# Patient Record
Sex: Female | Born: 1955 | Race: White | Hispanic: No | Smoking: Former smoker
Health system: Southern US, Community
[De-identification: ages and names within clinical notes are randomized; demographics above are authoritative.]

## PROBLEM LIST (undated history)

## (undated) DIAGNOSIS — M8580 Other specified disorders of bone density and structure, unspecified site: Secondary | ICD-10-CM

## (undated) DIAGNOSIS — I4892 Unspecified atrial flutter: Secondary | ICD-10-CM

## (undated) DIAGNOSIS — I340 Nonrheumatic mitral (valve) insufficiency: Secondary | ICD-10-CM

## (undated) DIAGNOSIS — M1990 Unspecified osteoarthritis, unspecified site: Secondary | ICD-10-CM

## (undated) DIAGNOSIS — C801 Malignant (primary) neoplasm, unspecified: Secondary | ICD-10-CM

## (undated) DIAGNOSIS — I350 Nonrheumatic aortic (valve) stenosis: Secondary | ICD-10-CM

## (undated) DIAGNOSIS — E039 Hypothyroidism, unspecified: Secondary | ICD-10-CM

## (undated) DIAGNOSIS — I480 Paroxysmal atrial fibrillation: Secondary | ICD-10-CM

## (undated) DIAGNOSIS — M539 Dorsopathy, unspecified: Secondary | ICD-10-CM

## (undated) DIAGNOSIS — I517 Cardiomegaly: Secondary | ICD-10-CM

## (undated) DIAGNOSIS — I499 Cardiac arrhythmia, unspecified: Secondary | ICD-10-CM

## (undated) DIAGNOSIS — I2510 Atherosclerotic heart disease of native coronary artery without angina pectoris: Secondary | ICD-10-CM

## (undated) DIAGNOSIS — I351 Nonrheumatic aortic (valve) insufficiency: Secondary | ICD-10-CM

## (undated) DIAGNOSIS — Z9889 Other specified postprocedural states: Secondary | ICD-10-CM

## (undated) DIAGNOSIS — E079 Disorder of thyroid, unspecified: Secondary | ICD-10-CM

## (undated) DIAGNOSIS — M810 Age-related osteoporosis without current pathological fracture: Secondary | ICD-10-CM

## (undated) DIAGNOSIS — I509 Heart failure, unspecified: Secondary | ICD-10-CM

## (undated) DIAGNOSIS — I10 Essential (primary) hypertension: Secondary | ICD-10-CM

## (undated) DIAGNOSIS — N289 Disorder of kidney and ureter, unspecified: Secondary | ICD-10-CM

## (undated) DIAGNOSIS — I4891 Unspecified atrial fibrillation: Secondary | ICD-10-CM

## (undated) HISTORY — PX: CARPAL TUNNEL RELEASE: SHX101

## (undated) HISTORY — PX: CARDIAC CATHETERIZATION: SHX172

## (undated) HISTORY — DX: Hypothyroidism, unspecified: E03.9

## (undated) HISTORY — PX: OTHER SURGICAL HISTORY: SHX169

## (undated) HISTORY — DX: Unspecified atrial flutter: I48.92

## (undated) HISTORY — DX: Paroxysmal atrial fibrillation: I48.0

## (undated) HISTORY — PX: EYE SURGERY: SHX253

---
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
## 2019-12-24 ENCOUNTER — Other Ambulatory Visit: Payer: Self-pay | Admitting: Internal Medicine

## 2019-12-24 DIAGNOSIS — Z1231 Encounter for screening mammogram for malignant neoplasm of breast: Secondary | ICD-10-CM

## 2020-02-05 ENCOUNTER — Encounter: Payer: Self-pay | Admitting: *Deleted

## 2020-02-05 ENCOUNTER — Other Ambulatory Visit: Payer: Self-pay

## 2020-02-05 ENCOUNTER — Emergency Department: Payer: BC Managed Care – PPO

## 2020-02-05 ENCOUNTER — Emergency Department
Admission: EM | Admit: 2020-02-05 | Discharge: 2020-02-05 | Disposition: A | Discharge status: Home / Self Care | Payer: BC Managed Care – PPO | Attending: Student in an Organized Health Care Education/Training Program | Admitting: Student in an Organized Health Care Education/Training Program

## 2020-02-05 DIAGNOSIS — M545 Low back pain, unspecified: Secondary | ICD-10-CM

## 2020-02-05 DIAGNOSIS — Z79890 Hormone replacement therapy (postmenopausal): Secondary | ICD-10-CM | POA: Insufficient documentation

## 2020-02-05 DIAGNOSIS — F17200 Nicotine dependence, unspecified, uncomplicated: Secondary | ICD-10-CM | POA: Diagnosis not present

## 2020-02-05 DIAGNOSIS — E039 Hypothyroidism, unspecified: Secondary | ICD-10-CM | POA: Insufficient documentation

## 2020-02-05 DIAGNOSIS — Z85828 Personal history of other malignant neoplasm of skin: Secondary | ICD-10-CM | POA: Diagnosis not present

## 2020-02-05 DIAGNOSIS — Z7982 Long term (current) use of aspirin: Secondary | ICD-10-CM | POA: Insufficient documentation

## 2020-02-05 DIAGNOSIS — I4891 Unspecified atrial fibrillation: Secondary | ICD-10-CM | POA: Insufficient documentation

## 2020-02-05 HISTORY — DX: Disorder of kidney and ureter, unspecified: N28.9

## 2020-02-05 HISTORY — DX: Other specified disorders of bone density and structure, unspecified site: M85.80

## 2020-02-05 HISTORY — DX: Unspecified atrial fibrillation: I48.91

## 2020-02-05 HISTORY — DX: Malignant (primary) neoplasm, unspecified: C80.1

## 2020-02-05 HISTORY — DX: Other specified postprocedural states: Z98.890

## 2020-02-05 HISTORY — DX: Unspecified osteoarthritis, unspecified site: M19.90

## 2020-02-05 HISTORY — DX: Disorder of thyroid, unspecified: E07.9

## 2020-02-05 LAB — URINALYSIS, COMPLETE (UACMP) WITH MICROSCOPIC
Bilirubin Urine: NEGATIVE
Glucose, UA: NEGATIVE mg/dL
Hgb urine dipstick: NEGATIVE
Ketones, ur: NEGATIVE mg/dL
Leukocytes,Ua: NEGATIVE
Nitrite: NEGATIVE
Protein, ur: NEGATIVE mg/dL
Specific Gravity, Urine: 1.01 (ref 1.005–1.030)
pH: 7 (ref 5.0–8.0)

## 2020-02-05 MED ORDER — LIDOCAINE 5 % EX PTCH
1.0000 | MEDICATED_PATCH | CUTANEOUS | 0 refills | Status: DC
Start: 2020-02-05 — End: 2020-02-05

## 2020-02-05 MED ORDER — LIDOCAINE 5 % EX PTCH
1.0000 | MEDICATED_PATCH | CUTANEOUS | 0 refills | Status: DC
Start: 2020-02-05 — End: 2020-07-15

## 2020-02-05 MED ORDER — OXYCODONE-ACETAMINOPHEN 5-325 MG PO TABS
1.0000 | ORAL_TABLET | Freq: Once | ORAL | Status: AC
  Administered 2020-02-05: 1 via ORAL
  Filled 2020-02-05: qty 1

## 2020-02-05 MED ORDER — OXYCODONE-ACETAMINOPHEN 5-325 MG PO TABS: 1.0000 | ORAL_TABLET | Freq: Three times a day (TID) | ORAL | 0 refills | Status: DC | PRN

## 2020-02-05 MED ORDER — METHOCARBAMOL 500 MG PO TABS: 500.0000 mg | ORAL_TABLET | Freq: Four times a day (QID) | ORAL | 0 refills | Status: DC

## 2020-02-05 MED ORDER — ORPHENADRINE CITRATE 30 MG/ML IJ SOLN
60.0000 mg | Freq: Two times a day (BID) | INTRAMUSCULAR | Status: DC
  Administered 2020-02-05: 60 mg via INTRAMUSCULAR
  Filled 2020-02-05: qty 2

## 2020-02-05 MED ORDER — LIDOCAINE 5 % EX PTCH
1.0000 | MEDICATED_PATCH | CUTANEOUS | Status: DC
  Administered 2020-02-05: 1 via TRANSDERMAL
  Filled 2020-02-05: qty 1

## 2020-02-05 NOTE — ED Provider Notes (Signed)
[facility] Emergency Department Provider Note  ____________________________________________  Time seen: Approximately 1:22 PM  I have reviewed the triage vital signs and the nursing notes.   HISTORY  Chief Complaint Back Pain    HPI Tricia Alvarez is a 64 y.o. female that presents to the emergency department for evaluation of right low back pain that radiates to the side of right hip and into the front of right leg for 11 days.  She had increased spasms to her back today.  Patient was evaluated at [facility] clinic 1 week ago.  She had x-rays completed but did not receive any official results.  She was started on steroids, Flexeril, Vicodin without improvement of symptoms.  They have given her a referral to physiatry and she has an appointment on Monday.  No trauma.  No bowel or bladder dysfunction or saddle anesthesias.  No fever, nausea, vomiting, abdominal pain, urinary symptoms.   Past Medical History:  Diagnosis Date   Arthritis    Atrial fibrillation ([place])    Cancer ([place])    skin and vulvular   History of cardiac radiofrequency ablation    Osteopenia    Renal disorder    right nephrectomy   Thyroid disease    hypothyroid    There are no problems to display for this patient.   Past Surgical History:  Procedure Laterality Date   CARPAL TUNNEL RELEASE Bilateral    CESAREAN SECTION      Prior to Admission medications   Medication Sig Start Date End Date Taking? Authorizing Provider  Ascorbic Acid (VITAMIN C) 1000 MG tablet Take 1,000 mg by mouth daily.   Yes [provider]  aspirin 325 MG tablet Take 325 mg by mouth daily.   Yes [provider]  cholecalciferol (VITAMIN D3) 25 MCG (1000 UNIT) tablet Take 2,000 Units by mouth daily.   Yes [provider]  cyclobenzaprine (FLEXERIL) 5 MG tablet Take 5 mg by mouth 3 (three) times daily as needed for muscle spasms.   Yes [provider]  diltiazem  (DILACOR XR) 180 MG 24 hr capsule Take 180 mg by mouth daily.   Yes [provider]  dronedarone (MULTAQ) 400 MG tablet Take 400 mg by mouth 2 (two) times daily with a meal.   Yes [provider]  fenoprofen (NALFON) 600 MG TABS tablet Take 600 mg by mouth.   Yes [provider]  HYDROcodone-acetaminophen (NORCO/VICODIN) 5-325 MG tablet Take 1 tablet by mouth 3 (three) times daily as needed for moderate pain.   Yes [provider]  levothyroxine (SYNTHROID) 75 MCG tablet Take 75 mcg by mouth daily before breakfast.   Yes [provider]  magnesium gluconate (MAGONATE) 500 MG tablet Take 500 mg by mouth daily.   Yes [provider]  Multiple Vitamin (MULTIVITAMIN) tablet Take 1 tablet by mouth daily.   Yes [provider]  lidocaine (LIDODERM) 5 % Place 1 patch onto the skin daily. Remove & Discard patch within 12 hours or as directed by MD 02/05/20   Laban Emperor, PA-C  methocarbamol (ROBAXIN) 500 MG tablet Take 1 tablet (500 mg total) by mouth 4 (four) times daily. 02/05/20   Laban Emperor, PA-C  oxyCODONE-acetaminophen (PERCOCET) 5-325 MG tablet Take 1 tablet by mouth every 8 (eight) hours as needed for severe pain. 02/05/20 02/04/21  Laban Emperor, PA-C    Allergies Morphine and related  No family history on file.  Social History Social History   Tobacco Use  Smoking status: Current Every Day Smoker   Smokeless tobacco: Never Used  Substance Use Topics   Alcohol use: Yes    Comment: socially   Drug use: Not on file     Review of Systems  Constitutional: No fever/chills Cardiovascular: No chest pain. Respiratory: No SOB. Gastrointestinal: No abdominal pain.  No nausea, no vomiting.  Musculoskeletal: Positive for back pain. Skin: Negative for rash, abrasions, lacerations, ecchymosis. Neurological: Negative for numbness or tingling   ____________________________________________   PHYSICAL EXAM:  VITAL  SIGNS: ED Triage Vitals  Enc Vitals Group     BP 02/05/20 1157 (!) 146/91     Pulse Rate 02/05/20 1157 84     Resp 02/05/20 1157 18     Temp 02/05/20 1157 97.9 F (36.6 C)     Temp Source 02/05/20 1157 Oral     SpO2 02/05/20 1157 98 %     Weight 02/05/20 1200 150 lb (68 kg)     Height 02/05/20 1200 5\' 7"  (1.702 m)     Head Circumference --      Peak Flow --      Pain Score 02/05/20 1159 10     Pain Loc --      Pain Edu? --      Excl. in [place]? --      Constitutional: Alert and oriented. Well appearing and in no acute distress. Eyes: Conjunctivae are normal. PERRL. EOMI. Head: Atraumatic. ENT:      Ears:      Nose: No congestion/rhinnorhea.      Mouth/Throat: Mucous membranes are moist.  Neck: No stridor.  Cardiovascular: Normal rate, regular rhythm.  Good peripheral circulation. Respiratory: Normal respiratory effort without tachypnea or retractions. Lungs CTAB. Good air entry to the bases with no decreased or absent breath sounds. Musculoskeletal: Full range of motion to all extremities. No gross deformities appreciated. Tenderness to palpation to right SI joint. Strength equal in lower extremities bilaterally. Full ROM of toes. Normal pain. Neurologic:  Normal speech and language. No gross focal neurologic deficits are appreciated.  Skin:  Skin is warm, dry and intact. No rash noted. Psychiatric: Mood and affect are normal. Speech and behavior are normal. Patient exhibits appropriate insight and judgement.   ____________________________________________   LABS (all labs ordered are listed, but only abnormal results are displayed)  Labs Reviewed  URINALYSIS, COMPLETE (UACMP) WITH MICROSCOPIC - Abnormal; Notable for the following components:      Result Value   Color, Urine YELLOW (*)    APPearance HAZY (*)    Bacteria, UA FEW (*)    All other components within normal limits    ____________________________________________  EKG   ____________________________________________  RADIOLOGY   No results found.  ____________________________________________    PROCEDURES  Procedure(s) performed:    Procedures    Medications  orphenadrine (NORFLEX) injection 60 mg (60 mg Intramuscular Given 02/05/20 1414)  lidocaine (LIDODERM) 5 % 1 patch (1 patch Transdermal Patch Applied 02/05/20 1415)  oxyCODONE-acetaminophen (PERCOCET/ROXICET) 5-325 MG per tablet 1 tablet (1 tablet Oral Given 02/05/20 1413)     ____________________________________________   INITIAL IMPRESSION / ASSESSMENT AND PLAN / ED COURSE  Pertinent labs & imaging results that were available during my care of the patient were reviewed by me and considered in my medical decision making (see chart for details).  Review of the [place] CSRS was performed in accordance of the [place] prior to dispensing any controlled drugs.   Patient's diagnosis is consistent with lumbar radiculopathy.  Patient  had x-rays completed 1 week ago at [place] [facility].  I am unable to see the results and patient received preliminary results from her PA but did not receive any official results.  Patient prefers not to repeat x-rays today.  She has an appointment with physiatry on Monday.  Patient was given IM Norflex, oral Percocet and Lidoderm patch for pain.  Patient will be discharged home with prescriptions for robaxin, percocet, lidoderm. Patient is to follow up with physiatry as directed. Patient is given ED precautions to return to the ED for any worsening or new symptoms.   Tricia Alvarez was evaluated in Emergency Department on 02/05/2020 for the symptoms described in the history of present illness. She was evaluated in the context of the global COVID-19 pandemic, which necessitated consideration that the patient might be at risk for infection with the SARS-CoV-2 virus that causes COVID-19. Institutional protocols and  algorithms that pertain to the evaluation of patients at risk for COVID-19 are in a state of rapid change based on information released by regulatory bodies including the CDC and federal and state organizations. These policies and algorithms were followed during the patient's care in the ED.  ____________________________________________  FINAL CLINICAL IMPRESSION(S) / ED DIAGNOSES  Final diagnoses:  Acute right-sided low back pain, unspecified whether sciatica present      NEW MEDICATIONS STARTED DURING THIS VISIT:  ED Discharge Orders         Ordered    methocarbamol (ROBAXIN) 500 MG tablet  4 times daily,   Status:  Discontinued     Reprint     02/05/20 1433    lidocaine (LIDODERM) 5 %  Every 24 hours,   Status:  Discontinued     Reprint     02/05/20 1433    oxyCODONE-acetaminophen (PERCOCET) 5-325 MG tablet  Every 8 hours PRN,   Status:  Discontinued     Reprint     02/05/20 1433    lidocaine (LIDODERM) 5 %  Every 24 hours     Discontinue  Reprint     02/05/20 1452    methocarbamol (ROBAXIN) 500 MG tablet  4 times daily     Discontinue  Reprint     02/05/20 1452    oxyCODONE-acetaminophen (PERCOCET) 5-325 MG tablet  Every 8 hours PRN     Discontinue  Reprint     02/05/20 1452              This chart was dictated using voice recognition software/Dragon. Despite best efforts to proofread, errors can occur which can change the meaning. Any change was purely unintentional.    Laban Emperor, PA-C 02/05/20 1518    Merlyn Lot, MD 02/05/20 [phone]

## 2020-02-05 NOTE — ED Triage Notes (Signed)
Patient c/o right lumbar pain that radiates down right leg for 11 days. Patient states she was seen at a walk-in clinic a week ago and had imaging and was discharged with prescriptions. Patient has a follow-up schedule for Monday, but spasms increased today.

## 2020-02-09 ENCOUNTER — Other Ambulatory Visit: Payer: Self-pay | Admitting: Physical Medicine & Rehabilitation

## 2020-02-09 DIAGNOSIS — M5441 Lumbago with sciatica, right side: Secondary | ICD-10-CM

## 2020-02-10 ENCOUNTER — Other Ambulatory Visit: Payer: Self-pay

## 2020-02-10 ENCOUNTER — Ambulatory Visit
Admission: RE | Admit: 2020-02-10 | Discharge: 2020-02-10 | Disposition: A | Discharge status: Home / Self Care | Payer: BC Managed Care – PPO | Source: Ambulatory Visit | Attending: Physical Medicine & Rehabilitation | Admitting: Physical Medicine & Rehabilitation

## 2020-02-10 DIAGNOSIS — M5441 Lumbago with sciatica, right side: Secondary | ICD-10-CM

## 2020-03-24 ENCOUNTER — Ambulatory Visit
Admission: RE | Admit: 2020-03-24 | Discharge: 2020-03-24 | Disposition: A | Discharge status: Home / Self Care | Payer: BC Managed Care – PPO | Source: Ambulatory Visit | Attending: Internal Medicine | Admitting: Internal Medicine

## 2020-03-24 ENCOUNTER — Other Ambulatory Visit: Payer: Self-pay

## 2020-03-24 DIAGNOSIS — Z1231 Encounter for screening mammogram for malignant neoplasm of breast: Secondary | ICD-10-CM | POA: Insufficient documentation

## 2020-07-15 ENCOUNTER — Ambulatory Visit: Payer: BC Managed Care – PPO | Admitting: Internal Medicine

## 2020-07-15 ENCOUNTER — Other Ambulatory Visit: Payer: Self-pay

## 2020-07-15 ENCOUNTER — Encounter: Payer: Self-pay | Admitting: Internal Medicine

## 2020-07-15 VITALS — BP 148/84 | HR 69 | Ht 67.0 in | Wt 152.5 lb

## 2020-07-15 DIAGNOSIS — I483 Typical atrial flutter: Secondary | ICD-10-CM | POA: Diagnosis not present

## 2020-07-15 DIAGNOSIS — I480 Paroxysmal atrial fibrillation: Secondary | ICD-10-CM | POA: Diagnosis not present

## 2020-07-15 NOTE — Progress Notes (Signed)
ELECTROPHYSIOLOGY CONSULT NOTE  Patient ID: Tricia Alvarez, MRN: [id number], DOB/AGE: 64-Jun-1957 64 y.o. Admit date: (Not on file) Date of Consult: 07/15/2020  Primary Physician: Kirk Ruths, MD Primary Cardiologist: previously in [place] is a 65 y.o. female who is being seen today for the evaluation of afib at the request of MA.    HPI Tricia Alvarez is a 65 y.o. female who is referred for management issues related to atrial fibrillation.  A few years ago, prior to COVID, she was on a cruise and had syncope.  She was found to be in atrial fibrillation-rapid.  Apparently she underwent cardioversion either unsure offshore.  By the time she had returned home to [facility] she was back in atrial fibrillation.  She was treated with diltiazem, dronaderone and then underwent catheter ablation.  Since that time she has had no clinical atrial fibrillation.  Eliquis was started.  A Holter monitor dated 3/21 reported no interval atrial fibrillation and it was recommended that her Eliquis be discontinued and aspirin 325 be initiated.  She has been taking that here to date.  She has also been taking her dronaderone.  Moreover, she is taking diltiazem which was started also at the time of her atrial fibrillation episode.  She denies any antecedent history of atrial fibrillation.  No sleep disordered breathing of which she is aware, no daytime somnolence.  She reports her echocardiogram was normal.   4/20 echocardiogram demonstrated normal LV function Date Cr K Hgb  1/21 1.0 3.7        Past Medical History:  Diagnosis Date  . Arthritis   . Atrial fibrillation ([place])   . 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 History of cardiac radiofrequency ablation   . Osteopenia   . Renal disorder    right nephrectomy  . Thyroid disease    hypothyroid      Surgical History:  Past Surgical History:  Procedure Laterality Date  . CARPAL TUNNEL RELEASE Bilateral   .  CESAREAN SECTION       Home Meds: Current Meds  Medication Sig  . Ascorbic Acid (VITAMIN C) 1000 MG tablet Take 1,000 mg by mouth daily.  Marland Kitchen aspirin 325 MG tablet Take 325 mg by mouth daily.  . cholecalciferol (VITAMIN D3) 25 MCG (1000 UNIT) tablet Take 2,000 Units by mouth daily.  Marland Kitchen diltiazem (DILACOR XR) 180 MG 24 hr capsule Take 180 mg by mouth daily.  Marland Kitchen dronedarone (MULTAQ) 400 MG tablet Take 400 mg by mouth 2 (two) times daily with a meal.  . fenoprofen (NALFON) 600 MG TABS tablet Take 600 mg by mouth.  . levothyroxine (SYNTHROID) 75 MCG tablet Take 75 mcg by mouth daily before breakfast.  . magnesium gluconate (MAGONATE) 500 MG tablet Take 500 mg by mouth daily.  . Multiple Vitamin (MULTIVITAMIN) tablet Take 1 tablet by mouth daily.    Allergies:  Allergies  Allergen Reactions  . Morphine And Related Nausea And Vomiting    Social History   Socioeconomic History  . Marital status: Divorced    Spouse name: Not on file  . Number of children: Not on file  . Years of education: Not on file  . Highest education level: Not on file  Occupational History  . Not on file  Tobacco Use  . Smoking status: Current Every Day Smoker  . Smokeless tobacco: Never Used  Substance and Sexual Activity  . Alcohol  use: Yes    Comment: socially  . Drug use: Not on file  . Sexual activity: Not on file  Other Topics Concern  . Not on file  Social History Narrative  . Not on file   Social Determinants of Health   Financial Resource Strain: Not on file  Food Insecurity: Not on file  Transportation Needs: Not on file  Physical Activity: Not on file  Stress: Not on file  Social Connections: Not on file  Intimate Partner Violence: Not on file     Family History  Problem Relation Age of Onset  . Breast cancer Cousin      ROS:  Please see the history of present illness.     All other systems reviewed and negative.    Physical Exam: Blood pressure (!) 148/84, pulse 69, height 5\' 7"   (1.702 m), weight 152 lb 8 oz (69.2 kg), SpO2 96 %. General: Well developed, well nourished female in no acute distress. Head: Normocephalic, atraumatic, sclera non-icteric, no xanthomas, nares are without discharge. EENT: normal  Lymph Nodes:  none Neck: Negative for carotid bruits. JVD not elevated. Back:without scoliosis kyphosis Lungs: Clear bilaterally to auscultation without wheezes, rales, or rhonchi. Breathing is unlabored. Heart: RRR with S1 S2. No  murmur . No rubs, or gallops appreciated. Abdomen: Soft, non-tender, non-distended with normoactive bowel sounds. No hepatomegaly. No rebound/guarding. No obvious abdominal masses. Msk:  Strength and tone appear normal for age. Extremities: No clubbing or cyanosis. No edema.  Distal pedal pulses are 2+ and equal bilaterally. Skin: Warm and Dry Neuro: Alert and oriented X 3. CN III-XII intact Grossly normal sensory and motor function . Psych:  Responds to questions appropriately with a normal affect.      Labs: Cardiac Enzymes No results for input(s): CKTOTAL, CKMB, TROPONINI in the last 72 hours. CBC No results found for: WBC, HGB, HCT, MCV, PLT PROTIME: No results for input(s): LABPROT, INR in the last 72 hours. Chemistry No results for input(s): NA, K, CL, CO2, BUN, CREATININE, CALCIUM, PROT, BILITOT, ALKPHOS, ALT, AST, GLUCOSE in the last 168 hours.  Invalid input(s): LABALBU Lipids No results found for: CHOL, HDL, LDLCALC, TRIG BNP No results found for: PROBNP Thyroid Function Tests: No results for input(s): TSH, T4TOTAL, T3FREE, THYROIDAB in the last 72 hours.  Invalid input(s): FREET3 Miscellaneous No results found for: DDIMER  Radiology/Studies:  No results found.  EKG: Sinus at 69 Intervals 13/11/41   Assessment and Plan:  Atrial fibrillation status post ablation  Elevated blood pressure  Cigarette use   The patient has atrial fibrillation with a prior history of ablation.  Currently managed with  dronaderone.  She asks that I agree that it is reasonable to stop the dronaderone and see whether atrial fibrillation recurs.  As we have no idea what the symptoms would be, I have recommended that she get a Fitbit to look for changes in heart rate as a manifestation.  I have also recommended that she stop her aspirin.  There are not data which I am aware that would justify ASA 706 for thromboembolic risk reduction in patients with atrial fibrillation.  Moreover, I am not sure that I would use anticoagulation in this 65 year old woman with possible elevated blood pressure.  In the event that she had recurrence of atrial fibrillation we would have to have that discussion.  The data from the [place] registry suggests that the the value of risk associated with blood pressure by itself Occam's razor is relatively low.  This begs the question as to whether she has hypertension.  It is her impression that she does not.  I am not so sanguine suspect that she does but she would like to stop the diltiazem which may be contributing to fatigue.  We will do so.  I have it asked her to get a blood pressure cuff and monitor her blood pressure weekly with a target of 130 or so.  In the event that it is higher I asked her to reach out to Dr. [name] for alternative antihypertensive therapy.  I have encouraged her to stop smoking.  She moved here for her granddaughter, Ivar Drape, and this may be impetus sufficient.  I have suggested that she consider patches and adjunctive gum  Virl Axe

## 2020-07-15 NOTE — Patient Instructions (Signed)
Medication Instructions:  - Your physician has recommended you make the following change in your medication:   1) STOP aspirin  2) STOP diltiazem  3) STOP multaq  *If you need a refill on your cardiac medications before your next appointment, please call your pharmacy*   Lab Work: - none ordered  If you have labs (blood work) drawn today and your tests are completely normal, you will receive your results only by: Marland Kitchen MyChart Message (if you have MyChart) OR . A paper copy in the mail If you have any lab test that is abnormal or we need to change your treatment, we will call you to review the results.   Testing/Procedures: - none ordered   Follow-Up: At [facility], you and your health needs are our priority.  As part of our continuing mission to provide you with exceptional heart care, we have created designated Provider Care Teams.  These Care Teams include your primary Cardiologist (physician) and Advanced Practice Providers (APPs -  Physician Assistants and Nurse Practitioners) who all work together to provide you with the care you need, when you need it.  We recommend signing up for the patient portal called "MyChart".  Sign up information is provided on this After Visit Summary.  MyChart is used to connect with patients for Virtual Visits (Telemedicine).  Patients are able to view lab/test results, encounter notes, upcoming appointments, etc.  Non-urgent messages can be sent to your provider as well.   To learn more about what you can do with MyChart, go to [email or web address].    Your next appointment:   6 month(s)  The format for your next appointment:   In Person  Provider:   Virl Axe, MD   Other Instructions 1) Try to get a FitBit to monitor heart rates 2) Try to obtain a Blood pressure cuff to monitor blood pressures at home    How to Take Your Blood Pressure Blood pressure is a measurement of how strongly your blood is pressing against the walls of  your arteries. Arteries are blood vessels that carry blood from your heart throughout your body. Your health care provider takes your blood pressure at each office visit. You can also take your own blood pressure at home with a blood pressure monitor. You may need to take your own blood pressure to:  Confirm a diagnosis of high blood pressure (hypertension).  Monitor your blood pressure over time.  Make sure your blood pressure medicine is working. Supplies needed:  Blood pressure monitor.  Dining room chair to sit in.  Table or desk.  Small notebook and pencil or pen. How to prepare To get the most accurate reading, avoid the following for 30 minutes before you check your blood pressure:  Drinking caffeine.  Drinking alcohol.  Eating.  Smoking.  Exercising. Five minutes before you check your blood pressure:  Use the bathroom and urinate so that you have an empty bladder.  Sit quietly in a dining room chair. Do not sit in a soft couch or an armchair. Do not talk. How to take your blood pressure To check your blood pressure, follow the instructions in the manual that came with your blood pressure monitor. If you have a digital blood pressure monitor, the instructions may be as follows: 1. Sit up straight in a chair. 2. Place your feet on the floor. Do not cross your ankles or legs. 3. Rest your left arm at the level of your heart on a table or desk or  on the arm of a chair. 4. Pull up your shirt sleeve. 5. Wrap the blood pressure cuff around the upper part of your left arm, 1 inch (2.5 cm) above your elbow. It is best to wrap the cuff around bare skin. 6. Fit the cuff snugly around your arm. You should be able to place only one finger between the cuff and your arm. 7. Position the cord so that it rests in the bend of your elbow. 8. Press the power button. 9. Sit quietly while the cuff inflates and deflates. 10. Read the digital reading on the monitor screen and write the  numbers down (record them) in a notebook. 11. Wait 2-3 minutes, then repeat the steps, starting at step 1.   What does my blood pressure reading mean? A blood pressure reading consists of a higher number over a lower number. Ideally, your blood pressure should be below 120/80. The first ("top") number is called the systolic pressure. It is a measure of the pressure in your arteries as your heart beats. The second ("bottom") number is called the diastolic pressure. It is a measure of the pressure in your arteries as the heart relaxes. Blood pressure is classified into five stages. The following are the stages for adults who do not have a short-term serious illness or a chronic condition. Systolic pressure and diastolic pressure are measured in a unit called mm Hg (millimeters of mercury).  Normal  Systolic pressure: below 175.  Diastolic pressure: below 80. Elevated  Systolic pressure: 102-585.  Diastolic pressure: below 80. Hypertension stage 1  Systolic pressure: 277-824.  Diastolic pressure: 23-53. Hypertension stage 2  Systolic pressure: 614 or above.  Diastolic pressure: 90 or above. You can have elevated blood pressure or hypertension even if only the systolic or only the diastolic number in your reading is higher than normal. Follow these instructions at home:  Check your blood pressure as often as recommended by your health care provider.  Check your blood pressure at the same time every day.  Take your monitor to the next appointment with your health care provider to make sure that: ? You are using it correctly. ? It provides accurate readings.  Be sure you understand what your goal blood pressure numbers are.  Tell your health care provider if you are having any side effects from blood pressure medicine.  Keep all follow-up visits as told by your health care provider. This is important. General tips  Your health care provider can suggest a reliable monitor that  will meet your needs. There are several types of home blood pressure monitors.  Choose a monitor that has an arm cuff. Do not choose a monitor that measures your blood pressure from your wrist or finger.  Choose a cuff that wraps snugly around your upper arm. You should be able to fit only one finger between your arm and the cuff.  You can buy a blood pressure monitor at most drugstores or online. Where to find more information American Heart Association: www.heart.org Contact a health care provider if:  Your blood pressure is consistently high. Get help right away if:  Your systolic blood pressure is higher than 180.  Your diastolic blood pressure is higher than 120. Summary  Blood pressure is a measurement of how strongly your blood is pressing against the walls of your arteries.  A blood pressure reading consists of a higher number over a lower number. Ideally, your blood pressure should be below 120/80.  Check your blood  pressure at the same time every day.  Avoid caffeine, alcohol, smoking, and exercise for 30 minutes prior to checking your blood pressure. These agents can affect the accuracy of the blood pressure reading. This information is not intended to replace advice given to you by your health care provider. Make sure you discuss any questions you have with your health care provider. Document Revised: 06/06/2019 Document Reviewed: 06/06/2019 Elsevier Patient Education  2021 [place].   Blood Pressure Record Sheet To take your blood pressure, you will need a blood pressure machine. You can buy a blood pressure machine (blood pressure monitor) at your clinic, drug store, or online. When choosing one, consider:  An automatic monitor that has an arm cuff.  A cuff that wraps snugly around your upper arm. You should be able to fit only one finger between your arm and the cuff.  A device that stores blood pressure reading results.  Do not choose a monitor that  measures your blood pressure from your wrist or finger. Follow your health care provider's instructions for how to take your blood pressure. To use this form:  Get one reading in the morning (a.m.) before you take any medicines.  Get one reading in the evening (p.m.) before supper.  Take at least 2 readings with each blood pressure check. This makes sure the results are correct. Wait 1-2 minutes between measurements.  Write down the results in the spaces on this form.  Repeat this once a week, or as told by your health care provider.  Make a follow-up appointment with your health care provider to discuss the results. Blood pressure log Date: _______________________  a.m. _____________________(1st reading) _____________________(2nd reading)  p.m. _____________________(1st reading) _____________________(2nd reading) Date: _______________________  a.m. _____________________(1st reading) _____________________(2nd reading)  p.m. _____________________(1st reading) _____________________(2nd reading) Date: _______________________  a.m. _____________________(1st reading) _____________________(2nd reading)  p.m. _____________________(1st reading) _____________________(2nd reading) Date: _______________________  a.m. _____________________(1st reading) _____________________(2nd reading)  p.m. _____________________(1st reading) _____________________(2nd reading) Date: _______________________  a.m. _____________________(1st reading) _____________________(2nd reading)  p.m. _____________________(1st reading) _____________________(2nd reading) This information is not intended to replace advice given to you by your health care provider. Make sure you discuss any questions you have with your health care provider. Document Revised: 10/01/2019 Document Reviewed: 10/01/2019 Elsevier Patient Education  2021 [employer].

## 2020-10-19 IMAGING — MG DIGITAL SCREENING BILAT W/ TOMO W/ CAD
8 series · 8 of 24 positions shown · non-contrast
Comparison: Previous exam(s).

CLINICAL DATA: Screening.

EXAM:
DIGITAL SCREENING BILATERAL MAMMOGRAM WITH TOMO AND CAD

[L CC synth-2D]
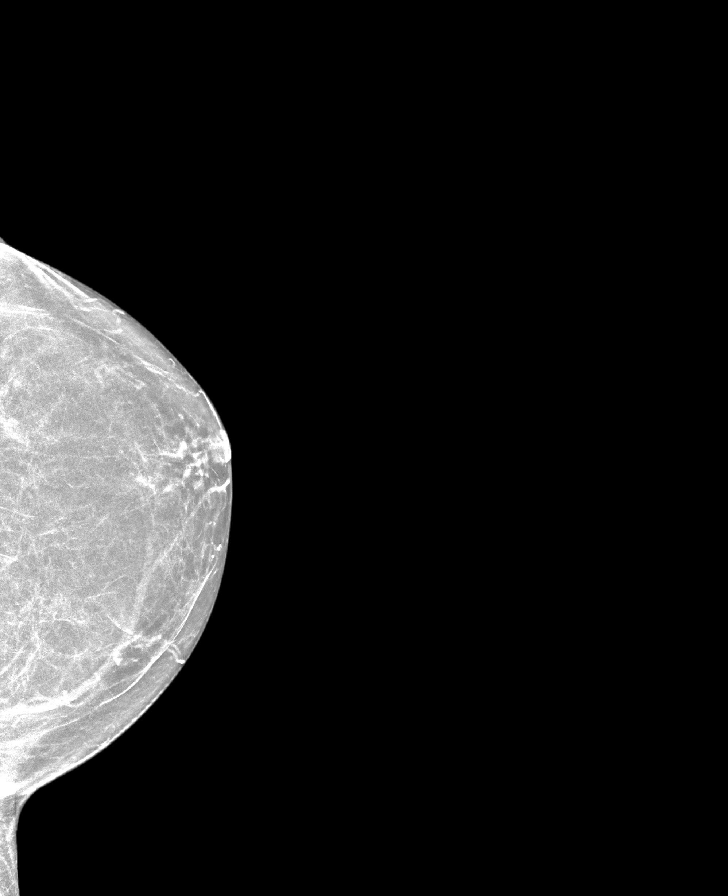

[R CC synth-2D]
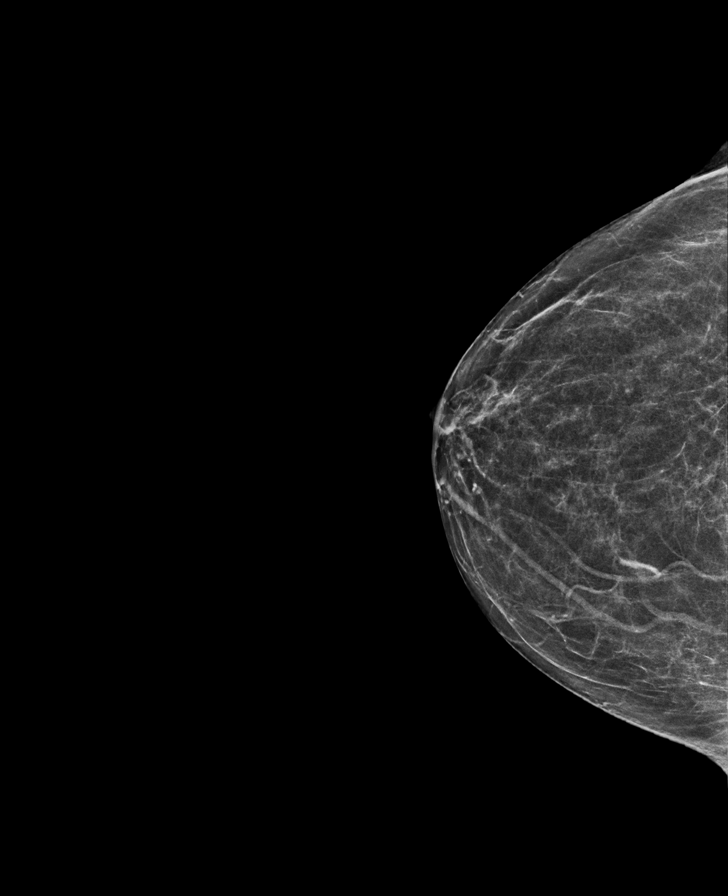

[L MLO synth-2D]
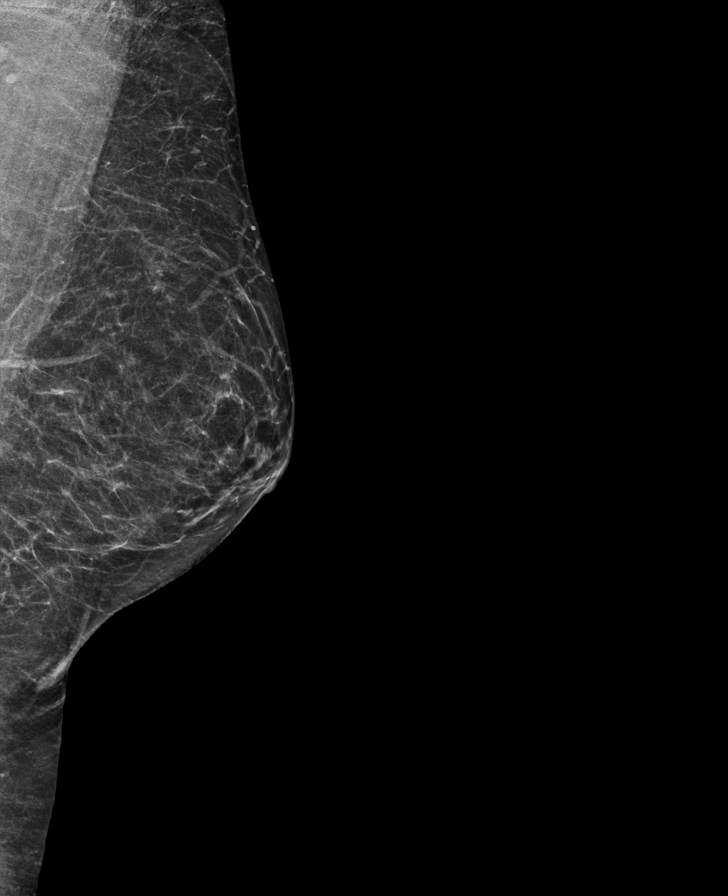

[R MLO synth-2D]
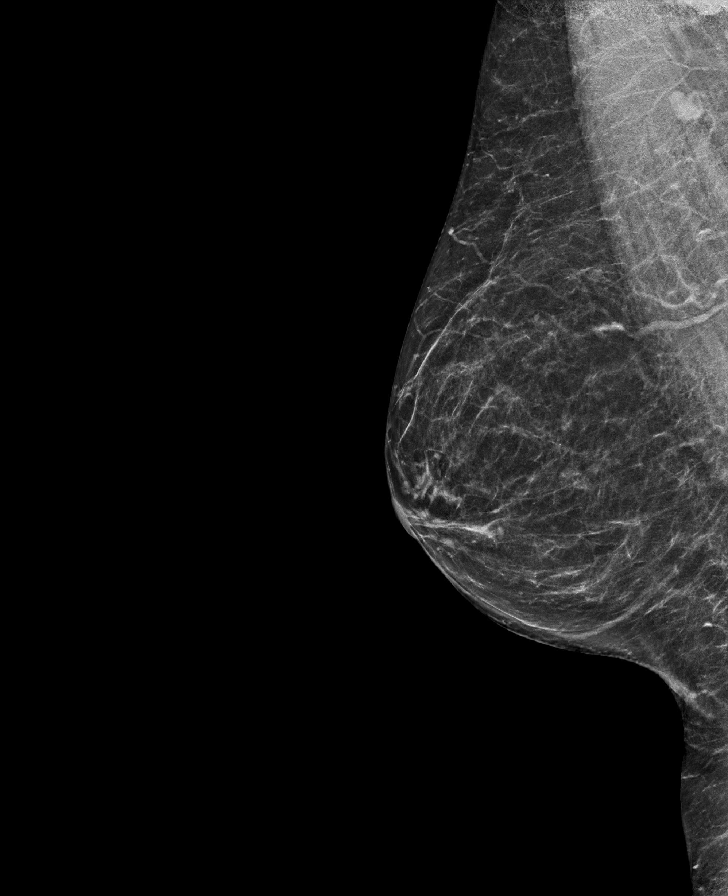

[R CC tomo · tomo slice 29/56.0]
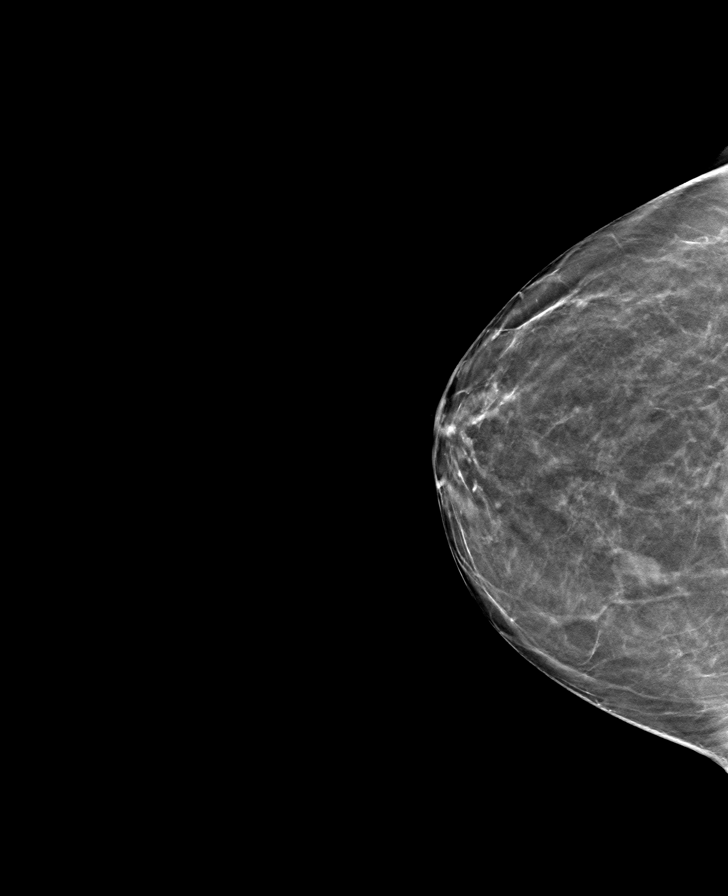

[L CC tomo · tomo slice 29/57.0]
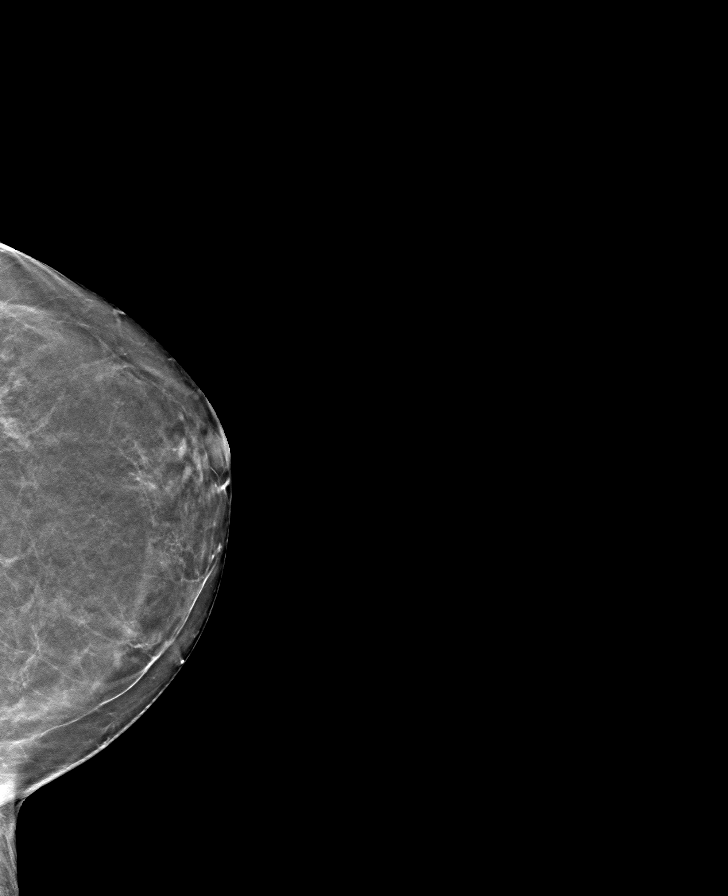

[L MLO tomo · tomo slice 28/55.0]
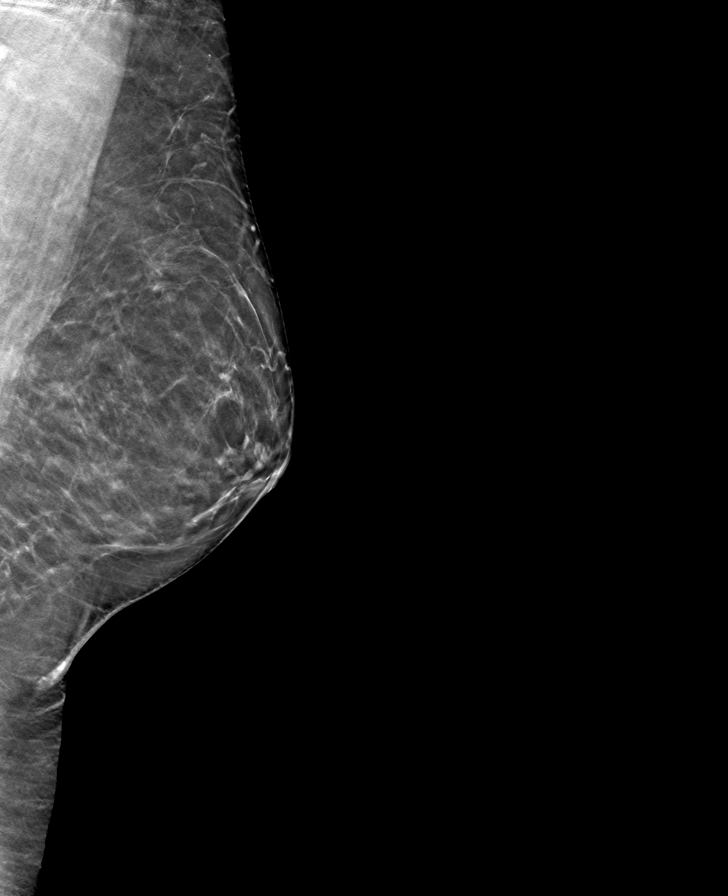

[R MLO tomo · tomo slice 30/59.0]
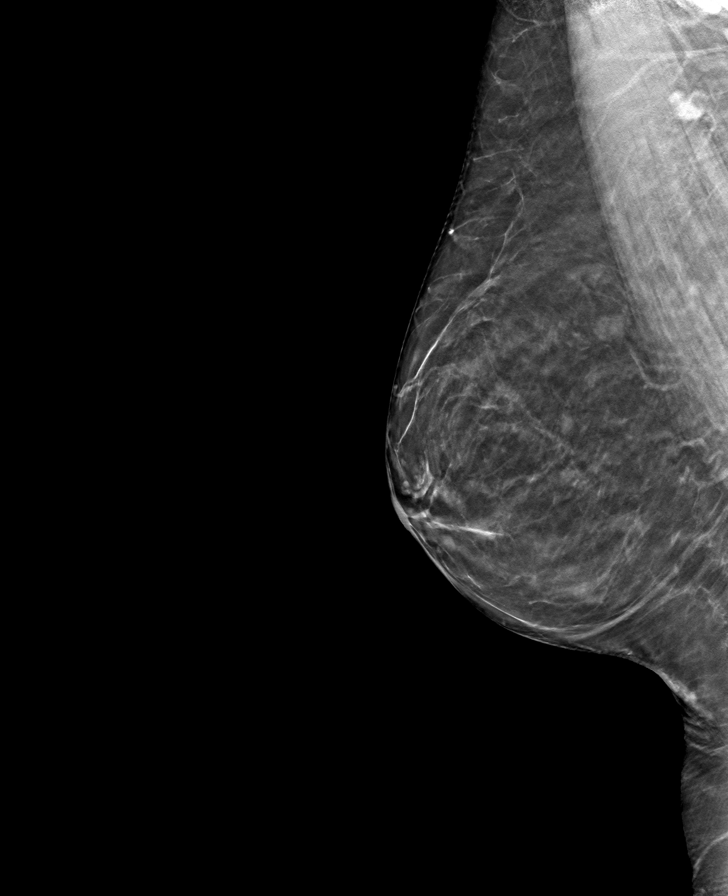

[8 of 24 positions shown; findings below may reference images not displayed]

ACR Breast Density Category b: There are scattered areas of
fibroglandular density.
FINDINGS: There are no findings suspicious for malignancy. Images were
processed with CAD.
IMPRESSION: No mammographic evidence of malignancy. A result letter of this
screening mammogram will be mailed directly to the patient.

RECOMMENDATION:
Screening mammogram in one year. (Code:[id number])

BI-RADS CATEGORY  1: Negative.

## 2020-12-13 ENCOUNTER — Other Ambulatory Visit: Payer: Self-pay | Admitting: Internal Medicine

## 2020-12-13 DIAGNOSIS — Z1231 Encounter for screening mammogram for malignant neoplasm of breast: Secondary | ICD-10-CM

## 2021-03-25 ENCOUNTER — Other Ambulatory Visit: Payer: Self-pay

## 2021-03-25 ENCOUNTER — Ambulatory Visit
Admission: RE | Admit: 2021-03-25 | Discharge: 2021-03-25 | Disposition: A | Discharge status: Home / Self Care | Payer: 59 | Source: Ambulatory Visit | Attending: Internal Medicine | Admitting: Internal Medicine

## 2021-03-25 DIAGNOSIS — Z1231 Encounter for screening mammogram for malignant neoplasm of breast: Secondary | ICD-10-CM | POA: Insufficient documentation

## 2021-05-24 ENCOUNTER — Encounter: Payer: Self-pay | Admitting: Internal Medicine

## 2021-05-25 ENCOUNTER — Encounter: Payer: Self-pay | Admitting: Internal Medicine

## 2021-05-25 ENCOUNTER — Ambulatory Visit: Payer: 59 | Admitting: Anesthesiology

## 2021-05-25 ENCOUNTER — Encounter
Admission: RE | Disposition: A | Discharge status: Home / Self Care | Payer: Self-pay | Source: Home / Self Care | Attending: Internal Medicine

## 2021-05-25 ENCOUNTER — Ambulatory Visit
Admission: RE | Admit: 2021-05-25 | Discharge: 2021-05-25 | Disposition: A | Discharge status: Home / Self Care | Payer: 59 | Attending: Internal Medicine | Admitting: Internal Medicine

## 2021-05-25 DIAGNOSIS — I4891 Unspecified atrial fibrillation: Secondary | ICD-10-CM | POA: Insufficient documentation

## 2021-05-25 DIAGNOSIS — D125 Benign neoplasm of sigmoid colon: Secondary | ICD-10-CM | POA: Insufficient documentation

## 2021-05-25 DIAGNOSIS — K5730 Diverticulosis of large intestine without perforation or abscess without bleeding: Secondary | ICD-10-CM | POA: Insufficient documentation

## 2021-05-25 DIAGNOSIS — D120 Benign neoplasm of cecum: Secondary | ICD-10-CM | POA: Insufficient documentation

## 2021-05-25 DIAGNOSIS — Z1211 Encounter for screening for malignant neoplasm of colon: Secondary | ICD-10-CM | POA: Diagnosis not present

## 2021-05-25 DIAGNOSIS — E039 Hypothyroidism, unspecified: Secondary | ICD-10-CM | POA: Insufficient documentation

## 2021-05-25 DIAGNOSIS — J449 Chronic obstructive pulmonary disease, unspecified: Secondary | ICD-10-CM | POA: Diagnosis not present

## 2021-05-25 DIAGNOSIS — F17200 Nicotine dependence, unspecified, uncomplicated: Secondary | ICD-10-CM | POA: Insufficient documentation

## 2021-05-25 DIAGNOSIS — K640 First degree hemorrhoids: Secondary | ICD-10-CM | POA: Diagnosis not present

## 2021-05-25 DIAGNOSIS — D127 Benign neoplasm of rectosigmoid junction: Secondary | ICD-10-CM | POA: Insufficient documentation

## 2021-05-25 HISTORY — PX: COLONOSCOPY WITH PROPOFOL: SHX5780

## 2021-05-25 HISTORY — DX: Cardiac arrhythmia, unspecified: I49.9

## 2021-05-25 HISTORY — DX: Age-related osteoporosis without current pathological fracture: M81.0

## 2021-05-25 SURGERY — COLONOSCOPY WITH PROPOFOL
Anesthesia: General

## 2021-05-25 MED ORDER — PROPOFOL 500 MG/50ML IV EMUL
INTRAVENOUS | Status: DC | PRN
  Administered 2021-05-25: 150 ug/kg/min via INTRAVENOUS

## 2021-05-25 MED ORDER — SODIUM CHLORIDE 0.9 % IV SOLN: INTRAVENOUS | Status: DC

## 2021-05-25 MED ORDER — PROPOFOL 10 MG/ML IV BOLUS
INTRAVENOUS | Status: AC
  Filled 2021-05-25: qty 20

## 2021-05-25 MED ORDER — SIMETHICONE 40 MG/0.6ML PO SUSP
ORAL | Status: DC | PRN
  Administered 2021-05-25: 120 mL

## 2021-05-25 MED ORDER — LIDOCAINE HCL (CARDIAC) PF 100 MG/5ML IV SOSY
PREFILLED_SYRINGE | INTRAVENOUS | Status: DC | PRN
  Administered 2021-05-25: 60 mg via INTRAVENOUS

## 2021-05-25 MED ORDER — PROPOFOL 10 MG/ML IV BOLUS
INTRAVENOUS | Status: DC | PRN
  Administered 2021-05-25: 80 mg via INTRAVENOUS
  Administered 2021-05-25: 20 mg via INTRAVENOUS
  Administered 2021-05-25: 30 mg via INTRAVENOUS
  Administered 2021-05-25: 40 mg via INTRAVENOUS

## 2021-05-25 MED ORDER — LIDOCAINE HCL (PF) 2 % IJ SOLN
INTRAMUSCULAR | Status: AC
  Filled 2021-05-25: qty 5

## 2021-05-25 MED ORDER — PROPOFOL 500 MG/50ML IV EMUL
INTRAVENOUS | Status: AC
  Filled 2021-05-25: qty 50

## 2021-05-25 NOTE — Anesthesia Preprocedure Evaluation (Signed)
Anesthesia Evaluation  Patient identified by MRN, date of birth, ID band Patient awake    Reviewed: Allergy & Precautions, NPO status , Patient's Chart, lab work & pertinent test results  Airway Mallampati: II  TM Distance: >3 FB Neck ROM: full    Dental  (+) Teeth Intact   Pulmonary neg pulmonary ROS, COPD, Current Smoker,    Pulmonary exam normal  + decreased breath sounds      Cardiovascular Exercise Tolerance: Good negative cardio ROS Normal cardiovascular exam+ dysrhythmias Atrial Fibrillation  Rhythm:Regular Rate:Tachycardia     Neuro/Psych Anxiety negative neurological ROS  negative psych ROS   GI/Hepatic negative GI ROS, Neg liver ROS,   Endo/Other  negative endocrine ROS  Renal/GU Renal diseasenegative Renal ROS  negative genitourinary   Musculoskeletal  (+) Arthritis ,   Abdominal Normal abdominal exam  (+)   Peds  Hematology negative hematology ROS (+)   Anesthesia Other Findings Past Medical History: No date: Arthritis No date: Atrial fibrillation (HCC) No date: Cancer ([place])     Comment:  skin and vulvular No date: Dysrhythmia No date: History of cardiac radiofrequency ablation No date: Osteopenia No date: Osteoporosis No date: Renal disorder     Comment:  right nephrectomy No date: Thyroid disease     Comment:  hypothyroid  Past Surgical History: No date: cardiac radiofrequency ablation No date: CARPAL TUNNEL RELEASE; Bilateral No date: CESAREAN SECTION  BMI    Body Mass Index: 22.71 kg/m      Reproductive/Obstetrics negative OB ROS                             Anesthesia Physical Anesthesia Plan  ASA: 3  Anesthesia Plan: General   Post-op Pain Management:    Induction: Intravenous  PONV Risk Score and Plan: Propofol infusion and TIVA  Airway Management Planned: Natural Airway and Nasal Cannula  Additional Equipment:   Intra-op Plan:    Post-operative Plan:   Informed Consent: I have reviewed the patients History and Physical, chart, labs and discussed the procedure including the risks, benefits and alternatives for the proposed anesthesia with the patient or authorized representative who has indicated his/her understanding and acceptance.     Dental Advisory Given  Plan Discussed with: CRNA and Surgeon  Anesthesia Plan Comments:         Anesthesia Quick Evaluation

## 2021-05-25 NOTE — H&P (Signed)
Outpatient short stay form Pre-procedure 05/25/2021 2:31 PM Tricia Alvarez K. Alice Reichert, M.D.  Primary Physician: Frazier Richards, M.D.  Reason for visit:  Colon cancer screening  History of present illness:  Patient presents for colonoscopy for colon cancer screening. The patient denies complaints of abdominal pain, significant change in bowel habits, or rectal bleeding.      Current Facility-Administered Medications:    0.9 %  sodium chloride infusion, , Intravenous, Continuous, Ishia Tenorio, Benay Pike, MD  Medications Prior to Admission  Medication Sig Dispense Refill Last Dose   Ascorbic Acid (VITAMIN C) 1000 MG tablet Take 1,000 mg by mouth daily.   Past Week   cholecalciferol (VITAMIN D3) 25 MCG (1000 UNIT) tablet Take 2,000 Units by mouth daily.   Past Week   fenoprofen (NALFON) 600 MG TABS tablet Take 600 mg by mouth.   Past Week   levothyroxine (SYNTHROID) 75 MCG tablet Take 75 mcg by mouth daily before breakfast.   05/24/2021   magnesium gluconate (MAGONATE) 500 MG tablet Take 500 mg by mouth daily.   Past Week   Multiple Vitamin (MULTIVITAMIN) tablet Take 1 tablet by mouth daily.   Past Week     Allergies  Allergen Reactions   Morphine And Related Nausea And Vomiting     Past Medical History:  Diagnosis Date   Arthritis    Atrial fibrillation ([place])    Cancer (HCC)    skin and vulvular   Dysrhythmia    History of cardiac radiofrequency ablation    Osteopenia    Osteoporosis    Renal disorder    right nephrectomy   Thyroid disease    hypothyroid    Review of systems:  Otherwise negative.    Physical Exam  Gen: Alert, oriented. Appears stated age.  HEENT: [place]/AT. PERRLA. Lungs: CTA, no wheezes. CV: RR nl S1, S2. Abd: soft, benign, no masses. BS+ Ext: No edema. Pulses 2+    Planned procedures: Proceed with colonoscopy. The patient understands the nature of the planned procedure, indications, risks, alternatives and potential complications including but not  limited to bleeding, infection, perforation, damage to internal organs and possible oversedation/side effects from anesthesia. The patient agrees and gives consent to proceed.  Please refer to procedure notes for findings, recommendations and patient disposition/instructions.     Jimmi Sidener K. Alice Reichert, M.D. Gastroenterology 05/25/2021  2:31 PM

## 2021-05-25 NOTE — Transfer of Care (Signed)
Immediate Anesthesia Transfer of Care Note  Patient: Drew Herman  Procedure(s) Performed: COLONOSCOPY WITH PROPOFOL  Patient Location: Endoscopy Unit  Anesthesia Type:General  Level of Consciousness: awake, alert  and oriented  Airway & Oxygen Therapy: Patient Spontanous Breathing  Post-op Assessment: Report given to RN and Post -op Vital signs reviewed and stable  Post vital signs: Reviewed and stable  Last Vitals:  Vitals Value Taken Time  BP 121/78 05/25/21 1513  Temp    Pulse 67 05/25/21 1513  Resp 19 05/25/21 1513  SpO2 100 % 05/25/21 1513    Last Pain:  Vitals:   05/25/21 1427  TempSrc: Temporal  PainSc: 0-No pain         Complications: No notable events documented.

## 2021-05-25 NOTE — Interval H&P Note (Signed)
History and Physical Interval Note:  05/25/2021 2:33 PM  Tricia Alvarez  has presented today for surgery, with the diagnosis of Screening (Z12.11z).  The various methods of treatment have been discussed with the patient and family. After consideration of risks, benefits and other options for treatment, the patient has consented to  Procedure(s): COLONOSCOPY WITH PROPOFOL (N/A) as a surgical intervention.  The patient's history has been reviewed, patient examined, no change in status, stable for surgery.  I have reviewed the patient's chart and labs.  Questions were answered to the patient's satisfaction.     [place], [place]

## 2021-05-25 NOTE — Op Note (Signed)
[facility] Gastroenterology Patient Name: Tricia Alvarez Procedure Date: 05/25/2021 2:33 PM MRN: [id number] Account #: [id number] Date of Birth: 15-Oct-1955 Admit Type: Outpatient Age: 65 Room: [facility] ENDO ROOM 2 Gender: Female Note Status: Finalized Instrument Name: Jasper Riling [id number] Procedure:             Colonoscopy Indications:           Screening for colorectal malignant neoplasm Providers:             Lorie Apley K. Alice Reichert MD, MD Referring MD:          Ocie Cornfield. [name] MD, MD (Referring MD) Medicines:             Propofol per Anesthesia Complications:         No immediate complications. Procedure:             Pre-Anesthesia Assessment:                        - The risks and benefits of the procedure and the                         sedation options and risks were discussed with the                         patient. All questions were answered and informed                         consent was obtained.                        - Patient identification and proposed procedure were                         verified prior to the procedure by the nurse. The                         procedure was verified in the procedure room.                        - ASA Grade Assessment: III - A patient with severe                         systemic disease.                        - After reviewing the risks and benefits, the patient                         was deemed in satisfactory condition to undergo the                         procedure.                        After obtaining informed consent, the colonoscope was                         passed under direct vision. Throughout the procedure,  the patient's blood pressure, pulse, and oxygen                         saturations were monitored continuously. The                         Colonoscope was introduced through the anus and                         advanced to the the cecum, identified by appendiceal                          orifice and ileocecal valve. The colonoscopy was                         performed without difficulty. The patient tolerated                         the procedure well. The quality of the bowel                         preparation was good. The ileocecal valve, appendiceal                         orifice, and rectum were photographed. Findings:      The perianal and digital rectal examinations were normal. Pertinent       negatives include normal sphincter tone and no palpable rectal lesions.      Non-bleeding internal hemorrhoids were found during retroflexion. The       hemorrhoids were Grade I (internal hemorrhoids that do not prolapse).      Many small and large-mouthed diverticula were found in the sigmoid       colon, ascending colon and cecum.      A 4 mm polyp was found in the rectum. The polyp was sessile. The polyp       was removed with a jumbo cold forceps. Resection and retrieval were       complete.      A 5 mm polyp was found in the sigmoid colon. The polyp was sessile. The       polyp was removed with a jumbo cold forceps. Resection and retrieval       were complete.      Two sessile polyps were found in the cecum. The polyps were 3 to 5 mm in       size. These polyps were removed with a jumbo cold forceps. Resection and       retrieval were complete.      The exam was otherwise without abnormality. Impression:            - Non-bleeding internal hemorrhoids.                        - Diverticulosis in the sigmoid colon, in the                         ascending colon and in the cecum.                        - One 4 mm polyp in the rectum, removed with a jumbo  cold forceps. Resected and retrieved.                        - One 5 mm polyp in the sigmoid colon, removed with a                         jumbo cold forceps. Resected and retrieved.                        - Two 3 to 5 mm polyps in the cecum, removed with a                          jumbo cold forceps. Resected and retrieved.                        - The examination was otherwise normal. Recommendation:        - Patient has a contact number available for                         emergencies. The signs and symptoms of potential                         delayed complications were discussed with the patient.                         Return to normal activities tomorrow. Written                         discharge instructions were provided to the patient.                        - Resume previous diet.                        - Continue present medications.                        - Repeat colonoscopy is recommended for surveillance.                         The colonoscopy date will be determined after                         pathology results from today's exam become available                         for review.                        - Return to GI office PRN.                        - The findings and recommendations were discussed with                         the patient. Procedure Code(s):     --- Professional ---                        [id number], Colonoscopy, flexible; with biopsy,  single or                         multiple Diagnosis Code(s):     --- Professional ---                        K57.30, Diverticulosis of large intestine without                         perforation or abscess without bleeding                        K63.5, Polyp of colon                        K62.1, Rectal polyp                        K64.0, First degree hemorrhoids                        Z12.11, Encounter for screening for malignant neoplasm                         of colon CPT copyright 2019 American Medical Association. All rights reserved. The codes documented in this report are preliminary and upon coder review may  be revised to meet current compliance requirements. Efrain Sella MD, MD 05/25/2021 3:12:27 PM This report has been signed electronically. Number of Addenda: 0 Note Initiated  On: 05/25/2021 2:33 PM Scope Withdrawal Time: 0 hours 7 minutes 2 seconds  Total Procedure Duration: 0 hours 14 minutes 55 seconds  Estimated Blood Loss:  Estimated blood loss: none.      [facility]

## 2021-05-26 ENCOUNTER — Ambulatory Visit: Payer: BC Managed Care – PPO | Admitting: Internal Medicine

## 2021-05-26 ENCOUNTER — Encounter: Payer: Self-pay | Admitting: Internal Medicine

## 2021-05-26 NOTE — Anesthesia Postprocedure Evaluation (Signed)
Anesthesia Post Note  Patient: Tricia Alvarez  Procedure(s) Performed: COLONOSCOPY WITH PROPOFOL  Patient location during evaluation: Endoscopy Anesthesia Type: General Level of consciousness: awake and alert Pain management: pain level controlled Vital Signs Assessment: post-procedure vital signs reviewed and stable Respiratory status: spontaneous breathing, nonlabored ventilation, respiratory function stable and patient connected to nasal cannula oxygen Cardiovascular status: blood pressure returned to baseline and stable Postop Assessment: no apparent nausea or vomiting Anesthetic complications: no   No notable events documented.   Last Vitals:  Vitals:   05/25/21 1513 05/25/21 1533  BP: 121/78 (!) 150/92  Pulse: 67   Resp: 19   Temp: 36.8 C   SpO2: 100%     Last Pain:  Vitals:   05/25/21 1533  TempSrc:   PainSc: 0-No pain                 Arita Miss

## 2021-05-27 LAB — SURGICAL PATHOLOGY

## 2021-06-30 DIAGNOSIS — Z1331 Encounter for screening for depression: Secondary | ICD-10-CM | POA: Diagnosis not present

## 2021-06-30 DIAGNOSIS — Z01419 Encounter for gynecological examination (general) (routine) without abnormal findings: Secondary | ICD-10-CM | POA: Diagnosis not present

## 2021-07-21 ENCOUNTER — Ambulatory Visit: Payer: Self-pay | Admitting: Internal Medicine

## 2021-09-01 ENCOUNTER — Ambulatory Visit: Payer: Self-pay | Admitting: Internal Medicine

## 2021-09-08 DIAGNOSIS — S93402A Sprain of unspecified ligament of left ankle, initial encounter: Secondary | ICD-10-CM | POA: Diagnosis not present

## 2021-09-08 DIAGNOSIS — M25572 Pain in left ankle and joints of left foot: Secondary | ICD-10-CM | POA: Diagnosis not present

## 2021-10-13 ENCOUNTER — Other Ambulatory Visit: Payer: Self-pay | Admitting: Internal Medicine

## 2021-10-13 DIAGNOSIS — Z1231 Encounter for screening mammogram for malignant neoplasm of breast: Secondary | ICD-10-CM

## 2021-11-10 ENCOUNTER — Ambulatory Visit: Payer: Self-pay | Admitting: Internal Medicine

## 2021-11-11 DIAGNOSIS — H524 Presbyopia: Secondary | ICD-10-CM | POA: Diagnosis not present

## 2021-12-12 DIAGNOSIS — H524 Presbyopia: Secondary | ICD-10-CM | POA: Diagnosis not present

## 2021-12-22 ENCOUNTER — Encounter: Payer: Self-pay | Admitting: Internal Medicine

## 2021-12-22 ENCOUNTER — Ambulatory Visit: Payer: Medicare Other | Admitting: Internal Medicine

## 2021-12-22 VITALS — BP 132/82 | HR 69 | Ht 66.0 in | Wt 144.0 lb

## 2021-12-22 DIAGNOSIS — I480 Paroxysmal atrial fibrillation: Secondary | ICD-10-CM | POA: Diagnosis not present

## 2021-12-22 DIAGNOSIS — I483 Typical atrial flutter: Secondary | ICD-10-CM

## 2021-12-22 NOTE — Patient Instructions (Signed)
Medication Instructions:  - Your physician recommends that you continue on your current medications as directed. Please refer to the Current Medication list given to you today.  *If you need a refill on your cardiac medications before your next appointment, please call your pharmacy*   Lab Work: - none ordered  If you have labs (blood work) drawn today and your tests are completely normal, you will receive your results only by: [place] (if you have MyChart) OR A paper copy in the mail If you have any lab test that is abnormal or we need to change your treatment, we will call you to review the results.   Testing/Procedures: - none ordered    Follow-Up: At [facility], you and your health needs are our priority.  As part of our continuing mission to provide you with exceptional heart care, we have created designated Provider Care Teams.  These Care Teams include your primary Cardiologist (physician) and Advanced Practice Providers (APPs -  Physician Assistants and Nurse Practitioners) who all work together to provide you with the care you need, when you need it.  We recommend signing up for the patient portal called "MyChart".  Sign up information is provided on this After Visit Summary.  MyChart is used to connect with patients for Virtual Visits (Telemedicine).  Patients are able to view lab/test results, encounter notes, upcoming appointments, etc.  Non-urgent messages can be sent to your provider as well.   To learn more about what you can do with MyChart, go to [email or web address].    Your next appointment:   As needed   The format for your next appointment:   In Person  Provider:   Virl Axe, MD    Other Instructions N/a  Important Information About Sugar

## 2021-12-22 NOTE — Progress Notes (Signed)
      Patient Care Team: Kirk Ruths, MD as PCP - General (Internal Medicine)   HPI  Tricia Alvarez is a 66 y.o. female seen in follow-up for atrial fibrillation.  For which she was treated in the past with dronaderone and then catheter ablation.  Holter monitor dated 3/21 demonstrated no interval atrial fibrillation her Eliquis was discontinued and aspirin was recommended.  I saw her 1/22 and recommended that she discontinue her aspirin.  No interval atrial fibrillation.   Not monitoring blood pressure at home.  The patient denies chest pain, shortness of breath, nocturnal dyspnea, orthopnea or peripheral edema.  There have been no palpitations, lightheadedness or syncope.    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   Dysrhythmia    History of cardiac radiofrequency ablation    Osteopenia    Osteoporosis    Renal disorder    right nephrectomy   Thyroid disease    hypothyroid    Past Surgical History:  Procedure Laterality Date   cardiac radiofrequency ablation     CARPAL TUNNEL RELEASE Bilateral    CESAREAN SECTION     COLONOSCOPY WITH PROPOFOL N/A 05/25/2021   Procedure: COLONOSCOPY WITH PROPOFOL;  Surgeon: Toledo, Benay Pike, MD;  Location: [facility] ENDOSCOPY;  Service: Gastroenterology;  Laterality: N/A;    Current Meds  Medication Sig   Ascorbic Acid (VITAMIN C) 1000 MG tablet Take 1,000 mg by mouth daily.   cholecalciferol (VITAMIN D3) 25 MCG (1000 UNIT) tablet Take 2,000 Units by mouth daily.   levothyroxine (SYNTHROID) 75 MCG tablet Take 75 mcg by mouth daily before breakfast.   magnesium gluconate (MAGONATE) 500 MG tablet Take 500 mg by mouth daily.   Multiple Vitamin (MULTIVITAMIN) tablet Take 1 tablet by mouth daily.    Allergies  Allergen Reactions   Morphine And Related Nausea And Vomiting      Review of  Systems negative except from HPI and PMH  Physical Exam BP 132/82 (BP Location: Left Arm, Patient Position: Sitting, Cuff Size: Normal)   Pulse 69   Ht '5\' 6"'$  (1.676 m)   Wt 144 lb (65.3 kg)   SpO2 98%   BMI 23.24 kg/m  Well developed and well nourished in no acute distress HENT normal E scleral and icterus clear Neck Supple JVP flat; carotids brisk and full Clear to ausculation Regular rate and rhythm, no murmurs gallops or rub Soft with active bowel sounds No clubbing cyanosis  Edema Alert and oriented, grossly normal motor and sensory function Skin Warm and Dry  ECG sinus at 69 Intervals 20/11/43 RSR prime PVC-isolated  CrCl cannot be calculated (No successful lab value found.).   Assessment and  Plan Atrial fibrillation status post ablation   Elevated blood pressure  PVC   Cigarette use   No interval atrial fibrillation of which she is aware.  Not on anticoagulation or antiplatelet therapy.  Blood pressure is a little bit elevated here.  Encouraged her to use her blood pressure machine at home.  Isolated PVC asymptomatic.  Encouraged her to discontinue smoking.     Current medicines are reviewed at length with the patient today .  The patient does not  have concerns regarding medicines.

## 2021-12-23 DIAGNOSIS — L570 Actinic keratosis: Secondary | ICD-10-CM | POA: Diagnosis not present

## 2021-12-28 DIAGNOSIS — L570 Actinic keratosis: Secondary | ICD-10-CM | POA: Diagnosis not present

## 2022-01-03 DIAGNOSIS — Z8679 Personal history of other diseases of the circulatory system: Secondary | ICD-10-CM | POA: Diagnosis not present

## 2022-01-03 DIAGNOSIS — E039 Hypothyroidism, unspecified: Secondary | ICD-10-CM | POA: Diagnosis not present

## 2022-01-10 DIAGNOSIS — Z0000 Encounter for general adult medical examination without abnormal findings: Secondary | ICD-10-CM | POA: Diagnosis not present

## 2022-01-10 DIAGNOSIS — Z0001 Encounter for general adult medical examination with abnormal findings: Secondary | ICD-10-CM | POA: Diagnosis not present

## 2022-01-10 DIAGNOSIS — Z8679 Personal history of other diseases of the circulatory system: Secondary | ICD-10-CM | POA: Diagnosis not present

## 2022-01-10 DIAGNOSIS — E039 Hypothyroidism, unspecified: Secondary | ICD-10-CM | POA: Diagnosis not present

## 2022-01-11 ENCOUNTER — Other Ambulatory Visit: Payer: Self-pay | Admitting: Internal Medicine

## 2022-01-11 DIAGNOSIS — F17210 Nicotine dependence, cigarettes, uncomplicated: Secondary | ICD-10-CM

## 2022-01-20 ENCOUNTER — Ambulatory Visit
Admission: RE | Admit: 2022-01-20 | Discharge: 2022-01-20 | Disposition: A | Discharge status: Home / Self Care | Payer: Medicare Other | Source: Ambulatory Visit | Attending: Internal Medicine | Admitting: Internal Medicine

## 2022-01-20 DIAGNOSIS — F17210 Nicotine dependence, cigarettes, uncomplicated: Secondary | ICD-10-CM

## 2022-03-29 ENCOUNTER — Ambulatory Visit
Admission: RE | Admit: 2022-03-29 | Discharge: 2022-03-29 | Disposition: A | Discharge status: Home / Self Care | Payer: Medicare Other | Source: Ambulatory Visit | Attending: Internal Medicine | Admitting: Internal Medicine

## 2022-03-29 DIAGNOSIS — Z1231 Encounter for screening mammogram for malignant neoplasm of breast: Secondary | ICD-10-CM | POA: Diagnosis not present

## 2022-05-09 DIAGNOSIS — L578 Other skin changes due to chronic exposure to nonionizing radiation: Secondary | ICD-10-CM | POA: Diagnosis not present

## 2022-05-09 DIAGNOSIS — Z872 Personal history of diseases of the skin and subcutaneous tissue: Secondary | ICD-10-CM | POA: Diagnosis not present

## 2022-05-09 DIAGNOSIS — D225 Melanocytic nevi of trunk: Secondary | ICD-10-CM | POA: Diagnosis not present

## 2022-05-09 DIAGNOSIS — Z85828 Personal history of other malignant neoplasm of skin: Secondary | ICD-10-CM | POA: Diagnosis not present

## 2022-05-09 DIAGNOSIS — Z859 Personal history of malignant neoplasm, unspecified: Secondary | ICD-10-CM | POA: Diagnosis not present

## 2022-05-26 DIAGNOSIS — L988 Other specified disorders of the skin and subcutaneous tissue: Secondary | ICD-10-CM | POA: Diagnosis not present

## 2022-05-26 DIAGNOSIS — D235 Other benign neoplasm of skin of trunk: Secondary | ICD-10-CM | POA: Diagnosis not present

## 2022-07-12 DIAGNOSIS — Z8679 Personal history of other diseases of the circulatory system: Secondary | ICD-10-CM | POA: Diagnosis not present

## 2022-07-12 DIAGNOSIS — R635 Abnormal weight gain: Secondary | ICD-10-CM | POA: Diagnosis not present

## 2022-07-12 DIAGNOSIS — R5383 Other fatigue: Secondary | ICD-10-CM | POA: Diagnosis not present

## 2022-07-12 DIAGNOSIS — Z124 Encounter for screening for malignant neoplasm of cervix: Secondary | ICD-10-CM | POA: Diagnosis not present

## 2022-07-12 DIAGNOSIS — Z1331 Encounter for screening for depression: Secondary | ICD-10-CM | POA: Diagnosis not present

## 2022-07-14 ENCOUNTER — Inpatient Hospital Stay
Admission: EM | Admit: 2022-07-14 | Discharge: 2022-07-19 | DRG: 308 | Disposition: A | Discharge status: Home / Self Care | Payer: Medicare Other | Attending: Family Medicine | Admitting: Family Medicine

## 2022-07-14 ENCOUNTER — Emergency Department: Payer: Medicare Other

## 2022-07-14 ENCOUNTER — Other Ambulatory Visit: Payer: Self-pay

## 2022-07-14 ENCOUNTER — Encounter: Payer: Self-pay | Admitting: Nurse Practitioner

## 2022-07-14 ENCOUNTER — Encounter: Payer: Self-pay | Admitting: Emergency Medicine

## 2022-07-14 ENCOUNTER — Ambulatory Visit: Payer: Medicare Other | Attending: Nurse Practitioner | Admitting: Nurse Practitioner

## 2022-07-14 VITALS — BP 120/80 | HR 172 | Ht 67.0 in | Wt 169.0 lb

## 2022-07-14 DIAGNOSIS — R051 Acute cough: Secondary | ICD-10-CM

## 2022-07-14 DIAGNOSIS — I483 Typical atrial flutter: Secondary | ICD-10-CM

## 2022-07-14 DIAGNOSIS — M810 Age-related osteoporosis without current pathological fracture: Secondary | ICD-10-CM | POA: Diagnosis present

## 2022-07-14 DIAGNOSIS — I480 Paroxysmal atrial fibrillation: Secondary | ICD-10-CM

## 2022-07-14 DIAGNOSIS — I200 Unstable angina: Secondary | ICD-10-CM | POA: Diagnosis not present

## 2022-07-14 DIAGNOSIS — I4892 Unspecified atrial flutter: Secondary | ICD-10-CM | POA: Diagnosis present

## 2022-07-14 DIAGNOSIS — Z87891 Personal history of nicotine dependence: Secondary | ICD-10-CM | POA: Diagnosis not present

## 2022-07-14 DIAGNOSIS — I340 Nonrheumatic mitral (valve) insufficiency: Secondary | ICD-10-CM | POA: Diagnosis not present

## 2022-07-14 DIAGNOSIS — Z79890 Hormone replacement therapy (postmenopausal): Secondary | ICD-10-CM

## 2022-07-14 DIAGNOSIS — F1020 Alcohol dependence, uncomplicated: Secondary | ICD-10-CM | POA: Diagnosis not present

## 2022-07-14 DIAGNOSIS — Z79899 Other long term (current) drug therapy: Secondary | ICD-10-CM | POA: Diagnosis not present

## 2022-07-14 DIAGNOSIS — I429 Cardiomyopathy, unspecified: Secondary | ICD-10-CM

## 2022-07-14 DIAGNOSIS — Z8544 Personal history of malignant neoplasm of other female genital organs: Secondary | ICD-10-CM | POA: Diagnosis not present

## 2022-07-14 DIAGNOSIS — Z7901 Long term (current) use of anticoagulants: Secondary | ICD-10-CM | POA: Diagnosis not present

## 2022-07-14 DIAGNOSIS — Z85828 Personal history of other malignant neoplasm of skin: Secondary | ICD-10-CM

## 2022-07-14 DIAGNOSIS — I509 Heart failure, unspecified: Secondary | ICD-10-CM

## 2022-07-14 DIAGNOSIS — I10 Essential (primary) hypertension: Secondary | ICD-10-CM | POA: Diagnosis not present

## 2022-07-14 DIAGNOSIS — I110 Hypertensive heart disease with heart failure: Secondary | ICD-10-CM | POA: Diagnosis present

## 2022-07-14 DIAGNOSIS — R059 Cough, unspecified: Secondary | ICD-10-CM | POA: Diagnosis not present

## 2022-07-14 DIAGNOSIS — Z803 Family history of malignant neoplasm of breast: Secondary | ICD-10-CM | POA: Diagnosis not present

## 2022-07-14 DIAGNOSIS — I5021 Acute systolic (congestive) heart failure: Secondary | ICD-10-CM | POA: Diagnosis not present

## 2022-07-14 DIAGNOSIS — R079 Chest pain, unspecified: Secondary | ICD-10-CM | POA: Diagnosis not present

## 2022-07-14 DIAGNOSIS — I420 Dilated cardiomyopathy: Secondary | ICD-10-CM | POA: Diagnosis not present

## 2022-07-14 DIAGNOSIS — I351 Nonrheumatic aortic (valve) insufficiency: Secondary | ICD-10-CM | POA: Diagnosis not present

## 2022-07-14 DIAGNOSIS — Z885 Allergy status to narcotic agent status: Secondary | ICD-10-CM

## 2022-07-14 DIAGNOSIS — I083 Combined rheumatic disorders of mitral, aortic and tricuspid valves: Secondary | ICD-10-CM | POA: Diagnosis not present

## 2022-07-14 DIAGNOSIS — Z8679 Personal history of other diseases of the circulatory system: Secondary | ICD-10-CM | POA: Diagnosis not present

## 2022-07-14 DIAGNOSIS — I5031 Acute diastolic (congestive) heart failure: Secondary | ICD-10-CM | POA: Diagnosis not present

## 2022-07-14 DIAGNOSIS — I4891 Unspecified atrial fibrillation: Secondary | ICD-10-CM | POA: Diagnosis not present

## 2022-07-14 DIAGNOSIS — I4819 Other persistent atrial fibrillation: Principal | ICD-10-CM | POA: Diagnosis present

## 2022-07-14 DIAGNOSIS — E039 Hypothyroidism, unspecified: Secondary | ICD-10-CM | POA: Diagnosis not present

## 2022-07-14 DIAGNOSIS — Z905 Acquired absence of kidney: Secondary | ICD-10-CM

## 2022-07-14 DIAGNOSIS — E860 Dehydration: Secondary | ICD-10-CM | POA: Diagnosis present

## 2022-07-14 DIAGNOSIS — I2510 Atherosclerotic heart disease of native coronary artery without angina pectoris: Secondary | ICD-10-CM | POA: Diagnosis not present

## 2022-07-14 LAB — BASIC METABOLIC PANEL
Anion gap: 12 (ref 5–15)
BUN: 14 mg/dL (ref 8–23)
CO2: 22 mmol/L (ref 22–32)
Calcium: 10.2 mg/dL (ref 8.9–10.3)
Chloride: 101 mmol/L (ref 98–111)
Creatinine, Ser: 0.87 mg/dL (ref 0.44–1.00)
GFR, Estimated: >60 mL/min (ref >60)
Glucose, Bld: 108 mg/dL — ABNORMAL HIGH (ref 70–99)
Potassium: 4 mmol/L (ref 3.5–5.1)
Sodium: 135 mmol/L (ref 135–145)

## 2022-07-14 LAB — CBC
HCT: 40.6 % (ref 36.0–46.0)
Hemoglobin: 13.1 g/dL (ref 12.0–15.0)
MCH: 30.5 pg (ref 26.0–34.0)
MCHC: 32.3 g/dL (ref 30.0–36.0)
MCV: 94.4 fL (ref 80.0–100.0)
Platelets: 246 10*3/uL (ref 150–400)
RBC: 4.3 MIL/uL (ref 3.87–5.11)
RDW: 14.6 % (ref 11.5–15.5)
WBC: 7.4 10*3/uL (ref 4.0–10.5)
nRBC: 0 % (ref 0.0–0.2)

## 2022-07-14 LAB — PROTIME-INR
INR: 1.4 — ABNORMAL HIGH (ref 0.8–1.2)
Prothrombin Time: 16.6 seconds — ABNORMAL HIGH (ref 11.4–15.2)

## 2022-07-14 LAB — MAGNESIUM: Magnesium: 1.4 mg/dL — ABNORMAL LOW (ref 1.7–2.4)

## 2022-07-14 LAB — HEPARIN LEVEL (UNFRACTIONATED): Heparin Unfractionated: 0.77 IU/mL — ABNORMAL HIGH (ref 0.30–0.70)

## 2022-07-14 LAB — T4, FREE: Free T4: 1.42 ng/dL — ABNORMAL HIGH (ref 0.61–1.12)

## 2022-07-14 LAB — TROPONIN I (HIGH SENSITIVITY)
Troponin I (High Sensitivity): 22 ng/L — ABNORMAL HIGH (ref <18)
Troponin I (High Sensitivity): 19 ng/L — ABNORMAL HIGH (ref <18)
Troponin I (High Sensitivity): 23 ng/L — ABNORMAL HIGH (ref <18)

## 2022-07-14 LAB — APTT: aPTT: 32 seconds (ref 24–36)

## 2022-07-14 LAB — BRAIN NATRIURETIC PEPTIDE: B Natriuretic Peptide: 708.8 pg/mL — ABNORMAL HIGH (ref 0.0–100.0)

## 2022-07-14 LAB — TSH: TSH: 3.347 u[IU]/mL (ref 0.350–4.500)

## 2022-07-14 LAB — HIV ANTIBODY (ROUTINE TESTING W REFLEX): HIV Screen 4th Generation wRfx: NONREACTIVE

## 2022-07-14 MED ORDER — DILTIAZEM HCL 25 MG/5ML IV SOLN
INTRAVENOUS | Status: AC
  Administered 2022-07-14: 15 mg via INTRAVENOUS
  Filled 2022-07-14: qty 5

## 2022-07-14 MED ORDER — CALCIUM CARBONATE 1250 (500 CA) MG PO TABS
1.0000 | ORAL_TABLET | Freq: Every day | ORAL | Status: DC
  Administered 2022-07-15 – 2022-07-19 (×4): 1250 mg via ORAL
  Filled 2022-07-14 (×5): qty 1

## 2022-07-14 MED ORDER — METOPROLOL SUCCINATE ER 50 MG PO TB24
50.0000 mg | ORAL_TABLET | Freq: Every day | ORAL | Status: DC
  Administered 2022-07-14 – 2022-07-15 (×2): 50 mg via ORAL
  Filled 2022-07-14 (×2): qty 1

## 2022-07-14 MED ORDER — LEVOTHYROXINE SODIUM 50 MCG PO TABS
50.0000 ug | ORAL_TABLET | Freq: Every day | ORAL | Status: DC
  Administered 2022-07-15 – 2022-07-19 (×5): 50 ug via ORAL
  Filled 2022-07-14 (×5): qty 1

## 2022-07-14 MED ORDER — ACETAMINOPHEN 650 MG RE SUPP: 650.0000 mg | Freq: Four times a day (QID) | RECTAL | Status: DC | PRN

## 2022-07-14 MED ORDER — SODIUM CHLORIDE 0.9 % IV SOLN
1.0000 g | Freq: Once | INTRAVENOUS | Status: AC
Start: 2022-07-14 — End: 2022-07-14
  Administered 2022-07-14: 1 g via INTRAVENOUS
  Filled 2022-07-14: qty 10

## 2022-07-14 MED ORDER — ADULT MULTIVITAMIN W/MINERALS CH
1.0000 | ORAL_TABLET | Freq: Every day | ORAL | Status: DC
  Administered 2022-07-15 – 2022-07-19 (×4): 1 via ORAL
  Filled 2022-07-14 (×5): qty 1

## 2022-07-14 MED ORDER — VITAMIN C 500 MG PO TABS
2000.0000 mg | ORAL_TABLET | Freq: Every day | ORAL | Status: DC
  Administered 2022-07-15 – 2022-07-19 (×4): 2000 mg via ORAL
  Filled 2022-07-14 (×5): qty 4

## 2022-07-14 MED ORDER — LORAZEPAM 2 MG PO TABS
0.0000 mg | ORAL_TABLET | Freq: Four times a day (QID) | ORAL | Status: AC
  Administered 2022-07-15: 1 mg via ORAL
  Filled 2022-07-14: qty 1

## 2022-07-14 MED ORDER — DILTIAZEM HCL 25 MG/5ML IV SOLN: 15.0000 mg | Freq: Once | INTRAVENOUS | Status: AC

## 2022-07-14 MED ORDER — THIAMINE HCL 100 MG/ML IJ SOLN
100.0000 mg | Freq: Every day | INTRAMUSCULAR | Status: DC
  Filled 2022-07-14: qty 2

## 2022-07-14 MED ORDER — ACETAMINOPHEN 325 MG PO TABS
650.0000 mg | ORAL_TABLET | Freq: Four times a day (QID) | ORAL | Status: DC | PRN
  Administered 2022-07-14 – 2022-07-17 (×2): 650 mg via ORAL
  Filled 2022-07-14 (×2): qty 2

## 2022-07-14 MED ORDER — ONDANSETRON HCL 4 MG PO TABS: 4.0000 mg | ORAL_TABLET | Freq: Four times a day (QID) | ORAL | Status: DC | PRN

## 2022-07-14 MED ORDER — MAGNESIUM GLUCONATE 500 MG PO TABS
500.0000 mg | ORAL_TABLET | Freq: Every day | ORAL | Status: DC
  Administered 2022-07-15 – 2022-07-19 (×5): 500 mg via ORAL
  Filled 2022-07-14 (×5): qty 1

## 2022-07-14 MED ORDER — THIAMINE MONONITRATE 100 MG PO TABS
100.0000 mg | ORAL_TABLET | Freq: Every day | ORAL | Status: DC
  Administered 2022-07-14 – 2022-07-19 (×5): 100 mg via ORAL
  Filled 2022-07-14 (×6): qty 1

## 2022-07-14 MED ORDER — VITAMIN D 25 MCG (1000 UNIT) PO TABS
2000.0000 [IU] | ORAL_TABLET | Freq: Every day | ORAL | Status: DC
  Administered 2022-07-15 – 2022-07-19 (×4): 2000 [IU] via ORAL
  Filled 2022-07-14 (×5): qty 2

## 2022-07-14 MED ORDER — DILTIAZEM HCL-DEXTROSE 125-5 MG/125ML-% IV SOLN (PREMIX)
5.0000 mg/h | INTRAVENOUS | Status: DC
  Administered 2022-07-14: 5 mg/h via INTRAVENOUS
  Administered 2022-07-15: 10 mg/h via INTRAVENOUS
  Filled 2022-07-14 (×2): qty 125

## 2022-07-14 MED ORDER — FUROSEMIDE 10 MG/ML IJ SOLN
40.0000 mg | Freq: Every day | INTRAMUSCULAR | Status: DC
  Administered 2022-07-14 – 2022-07-17 (×4): 40 mg via INTRAVENOUS
  Filled 2022-07-14 (×4): qty 4

## 2022-07-14 MED ORDER — FOLIC ACID 1 MG PO TABS
1.0000 mg | ORAL_TABLET | Freq: Every day | ORAL | Status: DC
  Administered 2022-07-15 – 2022-07-19 (×4): 1 mg via ORAL
  Filled 2022-07-14 (×5): qty 1

## 2022-07-14 MED ORDER — DILTIAZEM HCL 25 MG/5ML IV SOLN
25.0000 mg | Freq: Once | INTRAVENOUS | Status: AC
  Administered 2022-07-14: 25 mg via INTRAVENOUS
  Filled 2022-07-14: qty 5

## 2022-07-14 MED ORDER — SODIUM CHLORIDE 0.9 % IV BOLUS
1000.0000 mL | Freq: Once | INTRAVENOUS | Status: AC
Start: 2022-07-14 — End: 2022-07-14
  Administered 2022-07-14: 1000 mL via INTRAVENOUS

## 2022-07-14 MED ORDER — HEPARIN (PORCINE) 25000 UT/250ML-% IV SOLN
1150.0000 [IU]/h | INTRAVENOUS | Status: DC
  Administered 2022-07-14: 1150 [IU]/h via INTRAVENOUS
  Filled 2022-07-14: qty 250

## 2022-07-14 MED ORDER — HEPARIN BOLUS VIA INFUSION
4600.0000 [IU] | Freq: Once | INTRAVENOUS | Status: AC
  Administered 2022-07-14: 4600 [IU] via INTRAVENOUS
  Filled 2022-07-14: qty 4600

## 2022-07-14 MED ORDER — LORAZEPAM 2 MG PO TABS
0.0000 mg | ORAL_TABLET | Freq: Two times a day (BID) | ORAL | Status: AC
  Administered 2022-07-17: 1 mg via ORAL
  Filled 2022-07-14: qty 1

## 2022-07-14 MED ORDER — AZITHROMYCIN 500 MG IV SOLR
500.0000 mg | Freq: Once | INTRAVENOUS | Status: AC
Start: 2022-07-14 — End: 2022-07-14
  Administered 2022-07-14: 500 mg via INTRAVENOUS
  Filled 2022-07-14: qty 5

## 2022-07-14 MED ORDER — ONDANSETRON HCL 4 MG/2ML IJ SOLN: 4.0000 mg | Freq: Four times a day (QID) | INTRAMUSCULAR | Status: DC | PRN

## 2022-07-14 NOTE — H&P (Signed)
History and Physical    Patient: Tricia Alvarez FHL:[id number] DOB: 04/26/1956 DOA: 07/14/2022 DOS: the patient was seen and examined on 07/14/2022 PCP: Kirk Ruths, MD  Patient coming from: Home  Chief Complaint:  Chief Complaint  Patient presents with   Chest Pain   HPI: Tricia Alvarez is a 67 y.o. female with medical history significant for atrial fibrillation treated in the past with dronedarone and then catheter ablation (Eliquis and metoprolol were discontinued 03/21), history of hypothyroidism, alcohol dependence, status post nephrectomy who presents to the ER from the cardiology clinic for evaluation of rapid A-fib.  She was noted to have heart rate in the 170s during her cardiology appointment. Patient states that following heart catheter ablation several years ago she has done well but noted that about 5 days ago she developed palpitations associated with exertional shortness of breath and several near syncopal episodes.  She was seen by her gynecologist, 3 days prior to her admission and was advised to follow-up with her cardiologist because physical exam was consistent with irregular tachycardia. The night prior to her admission she developed 3 pillow orthopnea and states that she was unable to lay flat and could not get comfortable lying in bed, she also had left-sided chest pain which was nonradiating and palpitations.  Her symptoms improved with her sitting up in a recliner.  She has lower extremity swelling and has gained 10 pounds over the last 1 month.  She has a cough productive of clear phlegm.  She denied having any nausea, no vomiting, no diaphoresis, no abdominal pain, no changes in her bowel habits, no fever, no chills, no urinary symptoms, no blurred vision or focal deficit. Abnormal labs show free T4 of 1.42, troponin of 22 Chest x-ray reviewed by me shows patchy and linear opacities in both lung bases, which may represent atelectasis or pneumonia. Twelve-lead  EKG reviewed by me showed rapid A-fib. Patient received 2 doses of IV Cardizem and is currently on a Cardizem drip  Review of Systems: As mentioned in the history of present illness. All other systems reviewed and are negative. Past Medical History:  Diagnosis Date   Arthritis    Atrial flutter ([place])    a. 07/2018 - occurred on cruise ship. Flutter waves seen after adenosine->DCCV on ship; b. 10/2018 s/p RFCA.   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 prev on dronedarone; b. 08/2019 Holter: No afib-->eliquis d/c'd.   Renal disorder    right nephrectomy   Past Surgical History:  Procedure Laterality Date   cardiac radiofrequency ablation     CARPAL TUNNEL RELEASE Bilateral    CESAREAN SECTION     COLONOSCOPY WITH PROPOFOL N/A 05/25/2021   Procedure: COLONOSCOPY WITH PROPOFOL;  Surgeon: Toledo, Benay Pike, MD;  Location: [facility] ENDOSCOPY;  Service: Gastroenterology;  Laterality: N/A;   Social History:  reports that she quit smoking about 4 months ago. Her smoking use included cigarettes. She has never used smokeless tobacco. She reports current alcohol use. She reports that she does not use drugs.  Allergies  Allergen Reactions   Morphine And Related Nausea And Vomiting    Nausea  vomiting  headache    Family History  Problem Relation Age of Onset   Breast cancer Cousin     Prior to Admission medications  Medication Sig Start Date End Date Taking? Authorizing Provider  Ascorbic Acid (VITAMIN C) 1000 MG tablet Take 2,000 mg by mouth daily.   Yes [provider]  calcium carbonate (OSCAL) 1500 (600 Ca) MG TABS tablet Take 1 tablet by mouth daily.   Yes [provider]  cholecalciferol (VITAMIN D3) 25 MCG (1000 UNIT) tablet Take 2,000 Units by mouth daily.   Yes [provider]  levothyroxine (SYNTHROID) 75 MCG tablet  Take 75 mcg by mouth daily before breakfast.   Yes [provider]  magnesium gluconate (MAGONATE) 500 MG tablet Take 500 mg by mouth daily.   Yes [provider]  Multiple Vitamin (MULTIVITAMIN) tablet Take 1 tablet by mouth daily.   Yes [provider]    Physical Exam: Vitals:   07/14/22 1430 07/14/22 1445 07/14/22 1515 07/14/22 1600  BP: (!) 142/125   (!) 158/116  Pulse: (!) 38 (!) 124 (!) 116 (!) 57  Resp: '18 18 20 '$ (!) 24  Temp:      TempSrc:      SpO2: 97% (!) 89% 91% 96%   Physical Exam Vitals and nursing note reviewed.  Constitutional:      Appearance: She is well-developed.  Eyes:     Pupils: Pupils are equal, round, and reactive to light.  Cardiovascular:     Rate and Rhythm: Tachycardia present. Rhythm irregular.     Heart sounds: Normal heart sounds.  Pulmonary:     Effort: Tachypnea present.     Breath sounds: Examination of the right-lower field reveals rales. Examination of the left-lower field reveals rales. Rales present.  Abdominal:     General: Bowel sounds are normal.     Palpations: Abdomen is soft.  Musculoskeletal:     Cervical back: Normal range of motion and neck supple.     Right lower leg: Edema present.     Left lower leg: Edema present.  Skin:    General: Skin is warm and dry.  Neurological:     General: No focal deficit present.     Mental Status: She is alert.  Psychiatric:        Mood and Affect: Mood normal.        Behavior: Behavior normal.     Data Reviewed: Relevant notes from primary care and specialist visits, past discharge summaries as available in EHR, including Care Everywhere. Prior diagnostic testing as pertinent to current admission diagnoses Updated medications and problem lists for reconciliation ED course, including vitals, labs, imaging, treatment and response to treatment Triage notes, nursing and pharmacy notes and ED provider's notes Notable results as noted in HPI Labs reviewed.  Sodium  135, potassium 4.0, chloride 101, bicarb 22, glucose 108, BUN 14, creatinine 0.87, calcium 7.2, troponin 22, TSH 3.34, free T41.14, white count 7.4, hemoglobin 13.1, hematocrit 40.6, platelet count 246 Twelve-lead EKG reviewed by me shows rapid A-fib There are no new results to review at this time.  Assessment and Plan: * Atrial fibrillation with rapid ventricular response (HCC) Patient with a remote history of atrial fibrillation status post catheter ablation and has been off antiarrhythmic agents for several years as well as Eliquis who presents to the ER for evaluation of exertional shortness of breath, dizziness, lightheadedness, chest pain and was found to be tachycardic with heart rate in the 170s and noted to be in rapid A-fib on the monitor. She received 2 doses of IV Cardizem without improvement in her heart rate and is currently on a Cardizem drip  Will start patient on metoprolol 50 mg daily Obtain 2D echocardiogram to assess LVEF and rule out valvular pathology Patient has a CHA2DS2-VASc score of 3 and ideally requires long-term anticoagulation as primary prophylaxis for an acute stroke. Will start patient on IV heparin Consult cardiology  Alcohol dependence ([place]) Patient admits to daily alcohol use but denies having symptoms of alcohol withdrawal when she does not drink Will place on CIWA protocol and administer lorazepam for CIWA score of 8 or greater Place patient on thiamine, folic acid and MVI  Hypothyroidism Patient has been on Synthroid 75 mcg and labs show normal TSH with elevated free thyroxine level This could also be contributing to her rapid A-fib Will decrease Synthroid to 50 mcg daily Repeat TSH and free thyroxine levels in 6 to 8 weeks  Acute diastolic CHF (congestive heart failure) (HCC) Most likely rate related Symptoms include exertional shortness of breath, 3 pillow orthopnea and bilateral lower extremity swelling Obtain 2D echocardiogram to assess  LVEF Continue Lasix 40 mg IV daily Start patient on metoprolol to optimize rate control Consult cardiology      Advance Care Planning:   Code Status: Full Code   Consults: Cardiology  Family Communication: Greater than 50% of time was spent discussing patient's condition and plan of care with her and her son at the bedside.  All questions and concerns have been addressed.  They verbalized understanding and agree with the plan.    Severity of Illness: The appropriate patient status for this patient is INPATIENT. Inpatient status is judged to be reasonable and necessary in order to provide the required intensity of service to ensure the patient's safety. The patient's presenting symptoms, physical exam findings, and initial radiographic and laboratory data in the context of their chronic comorbidities is felt to place them at high risk for further clinical deterioration. Furthermore, it is not anticipated that the patient will be medically stable for discharge from the hospital within 2 midnights of admission.   * I certify that at the point of admission it is my clinical judgment that the patient will require inpatient hospital care spanning beyond 2 midnights from the point of admission due to high intensity of service, high risk for further deterioration and high frequency of surveillance required.*  Author: Collier Bullock, MD 07/14/2022 5:20 PM  For on call review [email or web address].

## 2022-07-14 NOTE — Assessment & Plan Note (Signed)
Patient with a remote history of atrial fibrillation status post catheter ablation and has been off antiarrhythmic agents for several years as well as Eliquis who presents to the ER for evaluation of exertional shortness of breath, dizziness, lightheadedness, chest pain and was found to be tachycardic with heart rate in the 170s and noted to be in rapid A-fib on the monitor. She received 2 doses of IV Cardizem without improvement in her heart rate and is currently on a Cardizem drip Will start patient on metoprolol 50 mg daily Obtain 2D echocardiogram to assess LVEF and rule out valvular pathology Patient has a CHA2DS2-VASc score of 3 and ideally requires long-term anticoagulation as primary prophylaxis for an acute stroke. Will start patient on IV heparin Consult cardiology

## 2022-07-14 NOTE — Assessment & Plan Note (Signed)
Patient has been on Synthroid 75 mcg and labs show normal TSH with elevated free thyroxine level This could also be contributing to her rapid A-fib Will decrease Synthroid to 50 mcg daily Repeat TSH and free thyroxine levels in 6 to 8 weeks

## 2022-07-14 NOTE — Assessment & Plan Note (Signed)
Most likely rate related Symptoms include exertional shortness of breath, 3 pillow orthopnea and bilateral lower extremity swelling Obtain 2D echocardiogram to assess LVEF Continue Lasix 40 mg IV daily Start patient on metoprolol to optimize rate control Consult cardiology

## 2022-07-14 NOTE — Patient Instructions (Signed)
Medication Instructions:  Your physician recommends that you continue on your current medications as directed. Please refer to the Current Medication list given to you today.  *If you need a refill on your cardiac medications before your next appointment, please call your pharmacy*   Lab Work: No labs ordered  If you have labs (blood work) drawn today and your tests are completely normal, you will receive your results only by: [place] (if you have MyChart) OR A paper copy in the mail If you have any lab test that is abnormal or we need to change your treatment, we will call you to review the results.   Testing/Procedures: No testing ordered  Follow-Up: At [facility], you and your health needs are our priority.  As part of our continuing mission to provide you with exceptional heart care, we have created designated Provider Care Teams.  These Care Teams include your primary Cardiologist (physician) and Advanced Practice Providers (APPs -  Physician Assistants and Nurse Practitioners) who all work together to provide you with the care you need, when you need it.  We recommend signing up for the patient portal called "MyChart".  Sign up information is provided on this After Visit Summary.  MyChart is used to connect with patients for Virtual Visits (Telemedicine).  Patients are able to view lab/test results, encounter notes, upcoming appointments, etc.  Non-urgent messages can be sent to your provider as well.   To learn more about what you can do with MyChart, go to [email or web address].    Your next appointment:   2 week(s)  Provider:   You will see one of the following Advanced Practice Providers on your designated Care Team:   Murray Hodgkins, NP

## 2022-07-14 NOTE — Progress Notes (Signed)
Office Visit    Patient Name: Tricia Alvarez Date of Encounter: 07/14/2022  Primary Care Provider:  Kirk Ruths, MD Primary Cardiologist:  Virl Axe, MD  Chief Complaint    67 year old female with a history of atrial flutter status post catheter ablation in May 2020, paroxysmal atrial fibrillation, hypertension, tobacco abuse, and hypothyroidism who presents for follow-up of atrial arrhythmias.  Past Medical History    Past Medical History:  Diagnosis Date   Arthritis    Atrial flutter ([place])    a. 07/2018 - occurred on cruise ship. Flutter waves seen after adenosine->DCCV on ship; b. 10/2018 s/p RFCA.   Cancer ([place])    skin and vulvular   History of cardiac radiofrequency ablation    Hypothyroidism    Osteopenia    Osteoporosis    PAF (paroxysmal atrial fibrillation) ([place])    a. prev on dronedarone; b. 08/2019 Holter: No afib-->eliquis d/c'd.   Renal disorder    right nephrectomy   Past Surgical History:  Procedure Laterality Date   cardiac radiofrequency ablation     CARPAL TUNNEL RELEASE Bilateral    CESAREAN SECTION     COLONOSCOPY WITH PROPOFOL N/A 05/25/2021   Procedure: COLONOSCOPY WITH PROPOFOL;  Surgeon: [place], Benay Pike, MD;  Location: [facility] ENDOSCOPY;  Service: Gastroenterology;  Laterality: N/A;    Allergies  Allergies  Allergen Reactions   Morphine And Related Nausea And Vomiting    Nausea  vomiting  headache    History of Present Illness    67 year old female with the above past medical history including atrial flutter, paroxysmal atrial fibrillation, hypertension, tobacco abuse, and hypothyroidism.  Patient previously followed by electrophysiology in [place], [place].  She was initially diagnosed with atrial fibrillation in February 2020, at which time she was on a cruise ship and became presyncopal resulting in a fall and brief loss of consciousness.  Records indicate that she was in a narrow complex tachycardia 149 bpm that did not  terminate with Valsalva.  She was treated with adenosine with clear demonstration of atrial flutter waves.  She subsequently underwent cardioversion and followed up with her doctors in [place], [place].  Decision was made to pursue catheter ablation.  In April 2020, prior to ablation, she was seen in the emergency department secondary to tachypalpitations and was found to be in atrial fibrillation.  She was supposed to go undergo TEE and cardioversion with a small left atrial appendage thrombus was noted and cardioversion was canceled.  She was placed on Eliquis therapy and subsequently converted to sinus rhythm on her own.  She was subsequently placed on Multaq without recurrence of A-fib.  Flutter ablation was successfully carried out May 2020.  In early 2021, she underwent Holter monitoring which showed no recurrence of atrial arrhythmias and Multaq and Eliquis were discontinued.  Ms. Mendonca establish care with Dr. Caryl Comes in January 2022 and was last seen in June 2023, at which time she was doing well.  She subsequently quit smoking and noted some wt gain over the course of the Fall.  Beginning on the evening of December 25, she has been experiencing intermittent presyncope and dyspnea on exertion.  This has been significantly worse over the past week.  She wears a Fitbit and says that for the first time in a long time, she had a heart rate over 100 earlier this week, though this is only been intermittent.  She saw her OB/GYN on January 17, it was noted to be irregular on  examination.  She was told that she was pregnant atrial fibrillation.  Vital signs indicate heart rate was only 73.  On presentation to the office today, she was found to be in rapid atrial fibrillation at 172 bpm.  Her Fitbit recorded a heart rate of 76, and thus has been inaccurate.  Upon further questioning, in addition to dyspnea on exertion and intermittent presyncope, last night, patient had tachypalpitations with a pounding sensation in  her chest as well as left chest and throat tightness associated with dyspnea.  Says she was only able to get about 2 hours of sleep.  When she woke this morning, she continued to have mild left chest tightness and dyspnea, though this has since improved.  She has noted mild lower extremity edema and her weight is up about 10 pounds over the past month.  She denies PND, orthopnea, syncope, or early satiety.  Home Medications    Current Outpatient Medications  Medication Sig Dispense Refill   Ascorbic Acid (VITAMIN C) 1000 MG tablet Take 2,000 mg by mouth daily.     calcium carbonate (OSCAL) 1500 (600 Ca) MG TABS tablet Take 1 tablet by mouth daily.     cholecalciferol (VITAMIN D3) 25 MCG (1000 UNIT) tablet Take 2,000 Units by mouth daily.     levothyroxine (SYNTHROID) 75 MCG tablet Take 75 mcg by mouth daily before breakfast.     magnesium gluconate (MAGONATE) 500 MG tablet Take 500 mg by mouth daily.     Multiple Vitamin (MULTIVITAMIN) tablet Take 1 tablet by mouth daily.     No current facility-administered medications for this visit.    Family History   Family History  Problem Relation Age of Onset   Breast cancer Cousin   No prior history of premature CAD.  Social History    Social History   Socioeconomic History   Marital status: Divorced    Spouse name: Not on file   Number of children: Not on file   Years of education: Not on file   Highest education level: Not on file  Occupational History   Not on file  Tobacco Use   Smoking status: Former    Types: Cigarettes    Quit date: 02/27/2022    Years since quitting: 0.3   Smokeless tobacco: Never  Vaping Use   Vaping Use: Never used  Substance and Sexual Activity   Alcohol use: Yes    Comment: socially   Drug use: Never   Sexual activity: Not on file  Other Topics Concern   Not on file  Social History Narrative   Lives locally.  Active without routinely exercising.   Social Determinants of Health   Financial  Resource Strain: Not on file  Food Insecurity: Not on file  Transportation Needs: Not on file  Physical Activity: Not on file  Stress: Not on file  Social Connections: Not on file  Intimate Partner Violence: Not on file    Review of Systems    +++ Palpitations, chest pain, dyspnea on exertion, lower extremity edema, 10 pound weight gain, presyncope.  She denies PND, orthopnea, syncope, or early satiety.  All other systems reviewed and are otherwise negative except as noted above.    Physical Exam    VS:  BP 120/80 (BP Location: Left Arm, Patient Position: Sitting, Cuff Size: Normal)   Pulse (!) 172   Ht '5\' 7"'$  (1.702 m)   Wt 169 lb (76.7 kg)   SpO2 97%   BMI 26.47 kg/m  ,  BMI Body mass index is 26.47 kg/m.     GEN: Well nourished, well developed, in no acute distress. HEENT: normal. Neck: Supple, mild JVD elevation, no carotid bruits, or masses. Cardiac: Irregularly irregular, no murmurs, rubs, or gallops. No clubbing, cyanosis, trace bilateral lower extremity edema.  Radials 2+/PT 2+ and equal bilaterally.  Respiratory:  Respirations regular and unlabored, clear to auscultation bilaterally. GI: Soft, nontender, nondistended, BS + x 4. MS: no deformity or atrophy. Skin: warm and dry, no rash. Neuro:  Strength and sensation are intact. Psych: Normal affect.  Accessory Clinical Findings    ECG personally reviewed by me today - atrial fibrillation, 172 incomplete right bundle branch block, nonspecific ST and T changes- no acute changes.  Labs dated July 12, 2022 from care everywhere  TSH 3.575  Labs dated January 03, 2022 from care everywhere  Sodium 132, potassium 4.2, chloride 94, CO2 31.5, BUN 9, creatinine 0.7, glucose 94 Total bilirubin 0.8, alkaline phosphatase 64, AST 23, ALT 17 Calcium 10.0, total protein 6.9, albumin 4.3 Total cholesterol 208, triglycerides 124, HDL 105.2, LDL 78  Assessment & Plan    1.  Atrial fibrillation with rapid ventricular response:  Patient with a history of both atrial flutter status post catheter ablation May 2020, as well as paroxysmal atrial fibrillation, which was diagnosed in April 2020, it was managed with Multaq, until both Multaq and Eliquis were discontinued in January 2021.  Over the past 3-4 weeks, patient has been experiencing dyspnea on exertion as well as intermittent presyncope.  This worsened earlier this week and was associated with tachypalpitations, dyspnea, and chest/throat tightness overnight with a very restless night of sleep.  On presentation today, heart rate 172 bpm and rapid atrial fibrillation.  We discussed options for management.  Given rapid rate with very concerning symptoms last night, we agreed that she would benefit most from ED evaluation and hospital admission.  Recommended adding diltiazem infusion and heparin with plan to switch to direct oral anticoagulant if it is determined that invasive studies are not required.  Follow-up lab work and echo.  TSH was normal on January 17.  If unable to be adequately rate controlled or she does not convert over the weekend, will need TEE and cardioversion on Monday.  2.  Acute heart failure: Patient with prior history of normal LV function presents with a 3 to 4-week history of dyspnea on exertion and 10 pound weight gain.  She has trace lower extremity edema with mildly elevated JVD.  Lungs are clear.  I suspect she will require a day or 2 of intravenous diuresis.  Follow-up basic metabolic panel as patient has a solitary kidney status post prior right nephrectomy, and plan to add Lasix 40 mg IV twice daily.  Check transthoracic echo as I am concerned that she may have developed a tachycardia induced cardiomyopathy. GDMT as indicated.  3.  Unstable angina: Patient developed left chest and throat tightness with associated dyspnea in the setting of tachypalpitations yesterday evening that persisted throughout the night.  Currently chest pain-free.  Nonspecific ST-T  changes on ECG.  ED for evaluation now.  Follow-up troponins.  Add heparin in the setting of above.  Follow-up echo.  4.  Essential hypertension: Blood pressure currently stable.  Follow-up on AV nodal blocking agents.  5.  Solitary kidney: Status post prior right nephrectomy.  Normal BUN and creatinine in July.  Follow-up labs.  6.  Atrial flutter: Status post catheter ablation May 2020.  7.  Disposition: Patient  to ED for evaluation outlined above.  Follow-up in clinic within 1 to 2 weeks of discharge.  Murray Hodgkins, NP 07/14/2022, 2:09 PM

## 2022-07-14 NOTE — ED Triage Notes (Addendum)
First nurse note: Pt to ED via [facility] for afib RVR HR 172. Pt has been symptomatic with HA and dizziness.

## 2022-07-14 NOTE — ED Provider Notes (Addendum)
[facility] Provider Note    Event Date/Time   First MD Initiated Contact with Patient 07/14/22 1420     (approximate)   History   Chest Pain   HPI  Tricia Alvarez is a 67 y.o. female   Past medical history of atrial fibrillation no longer on rate control nor anticoagulation per Dr. Recommendation, hypothyroid, who presents to the emergency department with atrial fibrillation and RVR.  She has had intermittent palpitations and exertional lightheadedness/shortness of breath for the past week.    She has had no respiratory infectious symptoms, abdominal pain, nausea vomiting or chest pain.  No GU symptoms.  P.o. intake has been adequate.  No other acute medical complaints.  External Medical Documents Reviewed: Cardiology progress note from earlier today which documents her history of atrial flutter/atrial fibrillation with discontinuation of her metoprolol and Eliquis after Holter monitoring revealed no recurrence of atrial arrhythmias back in early 2021.      Physical Exam   Triage Vital Signs: ED Triage Vitals  Enc Vitals Group     BP 07/14/22 1425 (!) 155/123     Pulse Rate 07/14/22 1427 (!) 180     Resp 07/14/22 1425 18     Temp 07/14/22 1427 98.3 F (36.8 C)     Temp Source 07/14/22 1427 Oral     SpO2 07/14/22 1427 100 %     Weight --      Height --      Head Circumference --      Peak Flow --      Pain Score 07/14/22 1423 0     Pain Loc --      Pain Edu? --      Excl. in [place]? --     Most recent vital signs: Vitals:   07/14/22 1445 07/14/22 1515  BP:    Pulse: (!) 124 (!) 116  Resp: 18 20  Temp:    SpO2: (!) 89% 91%    General: Awake, no distress.  CV:  Good peripheral perfusion.  Resp:  Normal effort.  Abd:  No distention.  Other:  Awake alert oriented skin appears warm and well-perfused tachycardia up to 170s irregular, abdomen soft and nontender and normotensive no hypoxia no fever.   ED Results / Procedures / Treatments    Labs (all labs ordered are listed, but only abnormal results are displayed) Labs Reviewed  BASIC METABOLIC PANEL - Abnormal; Notable for the following components:      Result Value   Glucose, Bld 108 (*)    All other components within normal limits  T4, FREE - Abnormal; Notable for the following components:   Free T4 1.42 (*)    All other components within normal limits  TROPONIN I (HIGH SENSITIVITY) - Abnormal; Notable for the following components:   Troponin I (High Sensitivity) 22 (*)    All other components within normal limits  CBC  TSH      EKG  ED ECG REPORT I, Lucillie Garfinkel, the attending physician, personally viewed and interpreted this ECG.   Date: 07/14/2022  EKG Time: 1427  Rate: 191  Rhythm: atrial fibrillation, rate 190s  Axis: nl  Intervals:none  ST&T Change: No acute ischemic changes.   PROCEDURES:  Critical Care performed: Yes, see critical care procedure note(s)  .Critical Care  Performed by: Lucillie Garfinkel, MD Authorized by: Lucillie Garfinkel, MD   Critical care provider statement:    Critical care time (minutes):  30   Critical  care was time spent personally by me on the following activities:  Development of treatment plan with patient or surrogate, discussions with consultants, evaluation of patient's response to treatment, examination of patient, ordering and review of laboratory studies, ordering and review of radiographic studies, ordering and performing treatments and interventions, pulse oximetry, re-evaluation of patient's condition and review of old charts (AF RVR req IV meds)    MEDICATIONS ORDERED IN ED: Medications  diltiazem (CARDIZEM) 125 mg in dextrose 5% 125 mL (1 mg/mL) infusion (has no administration in time range)  cefTRIAXone (ROCEPHIN) 1 g in sodium chloride 0.9 % 100 mL IVPB (has no administration in time range)  azithromycin (ZITHROMAX) 500 mg in sodium chloride 0.9 % 250 mL IVPB (has no administration in time range)  diltiazem  (CARDIZEM) injection 15 mg (15 mg Intravenous Given 07/14/22 1430)  sodium chloride 0.9 % bolus 1,000 mL (1,000 mLs Intravenous New Bag/Given 07/14/22 1434)  diltiazem (CARDIZEM) injection 25 mg (25 mg Intravenous Given 07/14/22 1449)    IMPRESSION / MDM / [place] / ED COURSE  I reviewed the triage vital signs and the nursing notes.                                Patient's presentation is most consistent with acute presentation with potential threat to life or bodily function.  Differential diagnosis includes, but is not limited to, atrial fibrillation with RVR, dehydration, electrolyte abnormalities, infection, ACS, PE   The patient is on the cardiac monitor to evaluate for evidence of arrhythmia and/or significant heart rate changes.  MDM: With recurrence of atrial fibrillation after discontinuing medications approximately 3 years ago acute inciting events or underlying infection or dehydration, will give IV diltiazem for rate control and defer cardioversion at this time given the chronicity of the symptoms greater than 48 hours.  She will need to restart on her rate control and anticoagulation and will follow-up with cardiology.  At this time disposition is pending her response to rate control in the emergency department, if unable to adequately rate control will put on infusion admit to hospitalist service.  I shared her chest x-ray findings of questionable opacities at lung bases with this patient and she now tells me that she has had a mild productive cough over the last several days.  I will cover her with community-acquired pneumonia coverage   I spoke with hospitalist Dr. Francine Graven for patient care plan.  Started heparin.        FINAL CLINICAL IMPRESSION(S) / ED DIAGNOSES   Final diagnoses:  Atrial fibrillation with RVR ([place])  Acute cough     Rx / DC Orders   ED Discharge Orders     None        Note:  This document was prepared using Dragon voice  recognition software and may include unintentional dictation errors.    Lucillie Garfinkel, MD 07/14/22 [id number]    Lucillie Garfinkel, MD 07/14/22 [place], MD 07/14/22 1534

## 2022-07-14 NOTE — ED Triage Notes (Signed)
Patient to ED via POV sent from [facility] due to patient being in A fib RVR at a rate of 170. Patient states she was some CP and SOB earlier this AM none at this time, intermittent since Monday. Hx of ablation and cardioversion. Patient no longer takes medication for a fib.

## 2022-07-14 NOTE — Consult Note (Signed)
ANTICOAGULATION CONSULT NOTE  Pharmacy Consult for Heparin Indication: atrial fibrillation  Allergies  Allergen Reactions   Morphine And Related Nausea And Vomiting    Nausea  vomiting  headache    Patient Measurements:   Heparin Dosing Weight: 76.7 kg  Vital Signs: Temp: 98.3 F (36.8 C) (01/19 1427) Temp Source: Oral (01/19 1427) BP: 158/116 (01/19 1600) Pulse Rate: 57 (01/19 1600)  Labs: Recent Labs    07/14/22 1426  HGB 13.1  HCT 40.6  PLT 246  CREATININE 0.87  TROPONINIHS 22*    Estimated Creatinine Clearance: 68.8 mL/min (by C-G formula based on SCr of 0.87 mg/dL).   Medical History: Past Medical History:  Diagnosis Date   Arthritis    Atrial flutter ([place])    a. 07/2018 - occurred on cruise ship. Flutter waves seen after adenosine->DCCV on ship; b. 10/2018 s/p RFCA.   Cancer ([place])    skin and vulvular   History of cardiac radiofrequency ablation    Hypothyroidism    Osteopenia    Osteoporosis    PAF (paroxysmal atrial fibrillation) ([place])    a. prev on dronedarone; b. 08/2019 Holter: No afib-->eliquis d/c'd.   Renal disorder    right nephrectomy    Medications:  Not on chronic anticoagulation PTA  Assessment: 67yo female with past medical history of atrial fibrillation no longer on rate control nor anticoagulation per her PCP's recommendation, hypothyroid, who presents to the emergency department with atrial fibrillation and RVR. Patient was previously on metoprolol and eliquis after Holter monitoring revealed no recurrence of atrial arrhythmias back in early 2021. She has had intermittent palpitations and exertional lightheadedness/shortness of breath for the past week. Pharmacy has been consulted to initiate and titrate continuous heparin infusion.  Baseline labs: aPTT and INR pending, Hgb 13.1, Plts 246  Goal of Therapy:  Heparin level 0.3-0.7 units/ml Monitor platelets by anticoagulation protocol: Yes   Plan:  Give 4600 units bolus x 1 Start  heparin infusion at 1150 units/hr Check anti-Xa level in 6 hours and daily while on heparin Continue to monitor H&H and platelets  Romona Murdy A Emmalee Solivan 07/14/2022,4:08 PM

## 2022-07-14 NOTE — ED Notes (Signed)
This RN to bedside to introduce self to pt. Pt is CAOx4 and in no acute distress. Pt playing games on her phone and denies any discomfort at this time.

## 2022-07-14 NOTE — Assessment & Plan Note (Signed)
Patient admits to daily alcohol use but denies having symptoms of alcohol withdrawal when she does not drink Will place on CIWA protocol and administer lorazepam for CIWA score of 8 or greater Place patient on thiamine, folic acid and MVI

## 2022-07-15 ENCOUNTER — Inpatient Hospital Stay (HOSPITAL_COMMUNITY)
Admit: 2022-07-15 | Discharge: 2022-07-15 | Disposition: A | Discharge status: Home / Self Care | Payer: Medicare Other | Attending: Internal Medicine | Admitting: Internal Medicine

## 2022-07-15 DIAGNOSIS — I10 Essential (primary) hypertension: Secondary | ICD-10-CM | POA: Diagnosis not present

## 2022-07-15 DIAGNOSIS — I4891 Unspecified atrial fibrillation: Secondary | ICD-10-CM

## 2022-07-15 DIAGNOSIS — Z8679 Personal history of other diseases of the circulatory system: Secondary | ICD-10-CM | POA: Diagnosis not present

## 2022-07-15 DIAGNOSIS — I5031 Acute diastolic (congestive) heart failure: Secondary | ICD-10-CM

## 2022-07-15 LAB — PROCALCITONIN: Procalcitonin: <0.1 ng/mL

## 2022-07-15 LAB — ECHOCARDIOGRAM COMPLETE
AR max vel: 1.94 cm2
AV Area VTI: 2.01 cm2
AV Area mean vel: 1.88 cm2
AV Mean grad: 2 mmHg
AV Peak grad: 4 mmHg
Ao pk vel: 1 m/s
Area-P 1/2: 6.83 cm2
Calc EF: 41.1 %
Height: 67 in
MV M vel: 4.7 m/s
MV Peak grad: 88.4 mmHg
MV VTI: 2.13 cm2
Radius: 0.57 cm
S' Lateral: 4.4 cm
Single Plane A2C EF: 52.4 %
Single Plane A4C EF: 32.7 %
Weight: 2704 [oz_av]

## 2022-07-15 LAB — CBC
HCT: 36.5 % (ref 36.0–46.0)
Hemoglobin: 11.6 g/dL — ABNORMAL LOW (ref 12.0–15.0)
MCH: 30.4 pg (ref 26.0–34.0)
MCHC: 31.8 g/dL (ref 30.0–36.0)
MCV: 95.5 fL (ref 80.0–100.0)
Platelets: 206 10*3/uL (ref 150–400)
RBC: 3.82 MIL/uL — ABNORMAL LOW (ref 3.87–5.11)
RDW: 14.7 % (ref 11.5–15.5)
WBC: 6.2 10*3/uL (ref 4.0–10.5)
nRBC: 0 % (ref 0.0–0.2)

## 2022-07-15 LAB — BASIC METABOLIC PANEL
Anion gap: 11 (ref 5–15)
BUN: 12 mg/dL (ref 8–23)
CO2: 22 mmol/L (ref 22–32)
Calcium: 9.2 mg/dL (ref 8.9–10.3)
Chloride: 104 mmol/L (ref 98–111)
Creatinine, Ser: 0.89 mg/dL (ref 0.44–1.00)
GFR, Estimated: >60 mL/min (ref >60)
Glucose, Bld: 95 mg/dL (ref 70–99)
Potassium: 3.9 mmol/L (ref 3.5–5.1)
Sodium: 137 mmol/L (ref 135–145)

## 2022-07-15 MED ORDER — DILTIAZEM HCL ER COATED BEADS 120 MG PO CP24
120.0000 mg | ORAL_CAPSULE | Freq: Every day | ORAL | Status: DC
  Administered 2022-07-15: 120 mg via ORAL
  Filled 2022-07-15: qty 1

## 2022-07-15 MED ORDER — METOPROLOL SUCCINATE ER 50 MG PO TB24
50.0000 mg | ORAL_TABLET | Freq: Every day | ORAL | Status: DC
  Administered 2022-07-16: 50 mg via ORAL
  Filled 2022-07-15: qty 1

## 2022-07-15 MED ORDER — PERFLUTREN LIPID MICROSPHERE
1.0000 mL | INTRAVENOUS | Status: AC | PRN
  Administered 2022-07-15: 3 mL via INTRAVENOUS

## 2022-07-15 MED ORDER — APIXABAN 5 MG PO TABS
5.0000 mg | ORAL_TABLET | Freq: Two times a day (BID) | ORAL | Status: DC
  Administered 2022-07-15 – 2022-07-19 (×9): 5 mg via ORAL
  Filled 2022-07-15 (×9): qty 1

## 2022-07-15 NOTE — Progress Notes (Addendum)
PROGRESS NOTE    Tricia Alvarez  PPJ:[id number] DOB: 14-Sep-1955 DOA: 07/14/2022  PCP: Kirk Ruths, MD Brief Narrative:  This 67 y.o. female with medical history significant for atrial fibrillation treated in the past with dronedarone and then catheter ablation (Eliquis and metoprolol were discontinued on03/21), history of hypothyroidism, alcohol dependence, status post nephrectomy who presents to the ER from the cardiology clinic for evaluation of rapid A-fib. She was noted to have heart rate in the 170s during her cardiology appointment.  Patient has developed palpitation associated with exertional shortness of breath for last 5 days.  She was seen by gynecologist 3-day prior to admission and was advised to follow-up with cardiologist because her physical exam consistent with irregular tachycardia.  Patient also developed orthopnea, not able to lie flat and sleeping in the recliner.  She has lower extremity swelling and has gained 10 pounds in last 1 month.  EKG shows A-fib with RVR.  Patient was given 2 doses of IV Cardizem without any improvement.  Subsequently she was started on Cardizem drip.  Admitted for further evaluation.  Cardiology is consulted.   Assessment & Plan:   Principal Problem:   Atrial fibrillation with RVR (HCC) Active Problems:   Acute diastolic CHF (congestive heart failure) (HCC)   Hypothyroidism   Alcohol dependence ([place])   Essential hypertension   History of atrial flutter  Atrial fibrillation with RVR: Patient with PMH of atrial fibrillation s/p catheter ablation and has been off antiarrhythmic agents for several years as well as Eliquis. She presented in the ED for the evaluation of exertional shortness of breath, palpitations, dizziness and intermittent chest discomfort. She was found to be tachycardic with heart rate in 170s and EKG showed rapid A-fib. She received 2 doses of IV Cardizem without improvement in her heart rate and is currently on a  Cardizem drip Start metoprolol 50 mg daily.  Cardizem discontinued. 2D echocardiogram shows EF 30 to 35%.  LV function mildly decreased. Cardiology is consulted. Patient started on Eliquis.   HR now controlled. Continue metoprolol and Eliquis.   ETOH depandence: Patient admits to daily alcohol use but denies having symptoms of alcohol withdrawal when she does not drink. Continue CIWA protocol and administer lorazepam as needed. Continue thiamine, folic acid and multivitamins.   Hypothyroidism: Patient has been on Synthroid 75 mcg and labs show normal TSH with elevated free thyroxine level This could also be contributing to her rapid A-fib Decrease Synthroid to 50 mcg daily Repeat TSH and free thyroxine levels in 6 to 8 weeks.   Acute on chronic systolic CHF. Most likely related to A-fib with RVR. Symptoms include exertional shortness of breath, 3 pillow orthopnea and bilateral lower extremity swelling 2D echocardiogram shows LVEF 30 to 35%. Continue Lasix 40 mg IV daily Continue metoprolol 50 mg twice daily.  Cardizem discontinued. Cardiology is consulted.  Medication adjusted.     DVT prophylaxis: Eliquis Code Status: Full code Family Communication: No family at bed side Disposition Plan:    Status is: Inpatient Remains inpatient appropriate because: Admitted for A-fib with RVR requiring Cardizem gtt. and started on Eliquis.  Consultants:  Cardiology  Procedures:TTE  Antimicrobials: None Subjective: Patient examined at bedside.  Overnight events noted.  Patient report doing much better, She denies any chest pain or shortness of breath.  Heart rate is better controlled now.  Objective: Vitals:   07/15/22 1130 07/15/22 1151 07/15/22 1251 07/15/22 1400  BP: (!) 121/94 (!) 121/94 108/83 (!) 108/90  Pulse: 87  87    Resp:  '17 19 19  '$ Temp:   97.7 F (36.5 C)   TempSrc:      SpO2:  94% 95% 95%    Intake/Output Summary (Last 24 hours) at 07/15/2022 1514 Last data  filed at 07/14/2022 1915 Gross per 24 hour  Intake 1000 ml  Output --  Net 1000 ml   There were no vitals filed for this visit.  Examination:  General exam: Appears comfortable, not in any acute distress. Respiratory system: CTA bilaterally, respiratory effort normal, RR 15. Cardiovascular system: S1 & S2 heard, regular rate and rhythm, no murmur. Gastrointestinal system: Abdomen is soft, nontender, nondistended, BS+ Central nervous system: Alert and oriented x 3. No focal neurological deficits. Extremities: Edema+, no cyanosis, no clubbing Skin: No rashes, lesions or ulcers Psychiatry: Judgement and insight appear normal. Mood & affect appropriate.     Data Reviewed: I have personally reviewed following labs and imaging studies  CBC: Recent Labs  Lab 07/14/22 1426 07/15/22 0537  WBC 7.4 6.2  HGB 13.1 11.6*  HCT 40.6 36.5  MCV 94.4 95.5  PLT 246 270   Basic Metabolic Panel: Recent Labs  Lab 07/14/22 1426 07/14/22 1700 07/15/22 0537  NA 135  --  137  K 4.0  --  3.9  CL 101  --  104  CO2 22  --  22  GLUCOSE 108*  --  95  BUN 14  --  12  CREATININE 0.87  --  0.89  CALCIUM 10.2  --  9.2  MG  --  1.4*  --    GFR: Estimated Creatinine Clearance: 67.3 mL/min (by C-G formula based on SCr of 0.89 mg/dL). Liver Function Tests: No results for input(s): "AST", "ALT", "ALKPHOS", "BILITOT", "PROT", "ALBUMIN" in the last 168 hours. No results for input(s): "LIPASE", "AMYLASE" in the last 168 hours. No results for input(s): "AMMONIA" in the last 168 hours. Coagulation Profile: Recent Labs  Lab 07/14/22 1700  INR 1.4*   Cardiac Enzymes: No results for input(s): "CKTOTAL", "CKMB", "CKMBINDEX", "TROPONINI" in the last 168 hours. BNP (last 3 results) No results for input(s): "PROBNP" in the last 8760 hours. HbA1C: No results for input(s): "HGBA1C" in the last 72 hours. CBG: No results for input(s): "GLUCAP" in the last 168 hours. Lipid Profile: No results for  input(s): "CHOL", "HDL", "LDLCALC", "TRIG", "CHOLHDL", "LDLDIRECT" in the last 72 hours. Thyroid Function Tests: Recent Labs    07/14/22 1426  TSH 3.347  FREET4 1.42*   Anemia Panel: No results for input(s): "VITAMINB12", "FOLATE", "FERRITIN", "TIBC", "IRON", "RETICCTPCT" in the last 72 hours. Sepsis Labs: Recent Labs  Lab 07/15/22 0537  PROCALCITON <0.10    No results found for this or any previous visit (from the past 240 hour(s)).    Radiology Studies: ECHOCARDIOGRAM COMPLETE  Result Date: 07/15/2022    ECHOCARDIOGRAM REPORT   Patient Name:   Charmeka [facility] Date of Exam: 07/15/2022 Medical Rec #:  [id number]       Height:       67.0 in Accession #:    [id number]      Weight:       169.0 lb Date of Birth:  February 02, 1956        BSA:          1.883 m Patient Age:    27 years        BP:           116/98 mmHg Patient Gender: F  HR:           98 bpm. Exam Location:  [facility] Procedure: 2D Echo, Color Doppler, Cardiac Doppler and Intracardiac            Opacification Agent Indications:     Atrial Fibrillation  History:         Patient has no prior history of Echocardiogram examinations.                  Arrythmias:Atrial Fibrillation.  Sonographer:     L. Thornton-Maynard Referring Phys:  [id number] OIZTIWPY AGBATA Diagnosing Phys: Ida Rogue MD  Sonographer Comments: Suboptimal apical window. IMPRESSIONS  1. Left ventricular ejection fraction, by estimation, is 30 to 35%. The left ventricle has moderately decreased function. The left ventricle demonstrates global hypokinesis. Left ventricular diastolic parameters are indeterminate.  2. Right ventricular systolic function is mildly reduced. The right ventricular size is moderately enlarged. There is normal pulmonary artery systolic pressure. The estimated right ventricular systolic pressure is 09.9 mmHg.  3. Left atrial size was moderately dilated.  4. Right atrial size was moderately dilated.  5. The mitral valve is normal in structure.  Moderate mitral valve regurgitation. No evidence of mitral stenosis.  6. Tricuspid valve regurgitation is moderate to severe.  7. The aortic valve is normal in structure. Aortic valve regurgitation is mild. Aortic valve sclerosis is present, with no evidence of aortic valve stenosis.  8. There is borderline dilatation of the ascending aorta, measuring 38 mm.  9. The inferior vena cava is normal in size with greater than 50% respiratory variability, suggesting right atrial pressure of 3 mmHg. FINDINGS  Left Ventricle: Left ventricular ejection fraction, by estimation, is 30 to 35%. The left ventricle has moderately decreased function. The left ventricle demonstrates global hypokinesis. Definity contrast agent was given IV to delineate the left ventricular endocardial borders. The left ventricular internal cavity size was normal in size. There is no left ventricular hypertrophy. Left ventricular diastolic parameters are indeterminate. Right Ventricle: The right ventricular size is moderately enlarged. No increase in right ventricular wall thickness. Right ventricular systolic function is mildly reduced. There is normal pulmonary artery systolic pressure. The tricuspid regurgitant velocity is 2.34 m/s, and with an assumed right atrial pressure of 8 mmHg, the estimated right ventricular systolic pressure is 83.3 mmHg. Left Atrium: Left atrial size was moderately dilated. Right Atrium: Right atrial size was moderately dilated. Pericardium: There is no evidence of pericardial effusion. Mitral Valve: The mitral valve is normal in structure. Moderate mitral valve regurgitation. No evidence of mitral valve stenosis. MV peak gradient, 3.5 mmHg. The mean mitral valve gradient is 1.5 mmHg. Tricuspid Valve: The tricuspid valve is normal in structure. Tricuspid valve regurgitation is moderate to severe. No evidence of tricuspid stenosis. Aortic Valve: The aortic valve is normal in structure. Aortic valve regurgitation is mild.  Aortic valve sclerosis is present, with no evidence of aortic valve stenosis. Aortic valve mean gradient measures 2.0 mmHg. Aortic valve peak gradient measures 4.0  mmHg. Aortic valve area, by VTI measures 2.01 cm. Pulmonic Valve: The pulmonic valve was normal in structure. Pulmonic valve regurgitation is trivial. No evidence of pulmonic stenosis. Aorta: The aortic root is normal in size and structure. There is borderline dilatation of the ascending aorta, measuring 38 mm. Venous: The inferior vena cava is normal in size with greater than 50% respiratory variability, suggesting right atrial pressure of 3 mmHg. IAS/Shunts: No atrial level shunt detected by color flow Doppler.  LEFT VENTRICLE PLAX 2D  LVIDd:         5.00 cm      Diastology LVIDs:         4.40 cm      LV e' medial:    7.94 cm/s LV PW:         0.70 cm      LV E/e' medial:  10.3 LV IVS:        1.10 cm      LV e' lateral:   11.10 cm/s LVOT diam:     2.10 cm      LV E/e' lateral: 7.4 LV SV:         27 LV SV Index:   14 LVOT Area:     3.46 cm  LV Volumes (MOD) LV vol d, MOD A2C: 106.0 ml LV vol d, MOD A4C: 55.4 ml LV vol s, MOD A2C: 50.5 ml LV vol s, MOD A4C: 37.3 ml LV SV MOD A2C:     55.5 ml LV SV MOD A4C:     55.4 ml LV SV MOD BP:      31.5 ml RIGHT VENTRICLE            IVC RV Basal diam:  4.40 cm    IVC diam: 3.10 cm RV S prime:     7.40 cm/s TAPSE (M-mode): 1.1 cm LEFT ATRIUM              Index        RIGHT ATRIUM           Index LA diam:        4.10 cm  2.18 cm/m   RA Area:     30.40 cm LA Vol (A2C):   108.0 ml 57.37 ml/m  RA Volume:   109.00 ml 57.90 ml/m LA Vol (A4C):   95.2 ml  50.57 ml/m LA Biplane Vol: 103.0 ml 54.71 ml/m  AORTIC VALVE                    PULMONIC VALVE AV Area (Vmax):    1.94 cm     PV Vmax:          0.49 m/s AV Area (Vmean):   1.88 cm     PV Peak grad:     1.0 mmHg AV Area (VTI):     2.01 cm     PR End Diast Vel: 5.02 msec AV Vmax:           99.50 cm/s AV Vmean:          67.600 cm/s AV VTI:            0.132 m AV Peak  Grad:      4.0 mmHg AV Mean Grad:      2.0 mmHg LVOT Vmax:         55.80 cm/s LVOT Vmean:        36.700 cm/s LVOT VTI:          0.077 m LVOT/AV VTI ratio: 0.58  AORTA Ao Root diam: 3.30 cm Ao Asc diam:  3.80 cm MITRAL VALVE                  TRICUSPID VALVE MV Area (PHT): 6.83 cm       TR Peak grad:   21.9 mmHg MV Area VTI:   2.13 cm       TR Vmax:        234.00 cm/s MV Peak grad:  3.5 mmHg MV  Mean grad:  1.5 mmHg       SHUNTS MV Vmax:       0.93 m/s       Systemic VTI:  0.08 m MV Vmean:      64.0 cm/s      Systemic Diam: 2.10 cm MV Decel Time: 111 msec MR Peak grad:    88.4 mmHg MR Mean grad:    58.7 mmHg MR Vmax:         470.00 cm/s MR Vmean:        364.0 cm/s MR PISA:         2.02 cm MR PISA Eff ROA: 17 mm MR PISA Radius:  0.57 cm MV E velocity: 82.00 cm/s Ida Rogue MD Electronically signed by Ida Rogue MD Signature Date/Time: 07/15/2022/2:15:05 PM    Final    DG Chest 2 View  Result Date: 07/14/2022 CLINICAL DATA:  Chest pain EXAM: CHEST - 2 VIEW COMPARISON:  Chest x-ray 01/20/2022 FINDINGS: There is some patchy and linear opacities in both lung bases. The heart is mildly enlarged. There is no pleural effusion or pneumothorax. No acute fractures are identified. IMPRESSION: Patchy and linear opacities in both lung bases, which may represent atelectasis or pneumonia. Electronically Signed   By: Ronney Asters M.D.   On: 07/14/2022 15:05    Scheduled Meds:  apixaban  5 mg Oral BID   vitamin C  2,000 mg Oral Daily   calcium carbonate  1 tablet Oral Daily   cholecalciferol  2,000 Units Oral Daily   folic acid  1 mg Oral Daily   furosemide  40 mg Intravenous Daily   levothyroxine  50 mcg Oral Q0600   LORazepam  0-4 mg Oral Q6H   Followed by   Derrill Memo ON 07/16/2022] LORazepam  0-4 mg Oral Q12H   magnesium gluconate  500 mg Oral Daily   [START ON 07/16/2022] metoprolol succinate  50 mg Oral Daily   multivitamin with minerals  1 tablet Oral Daily   thiamine  100 mg Oral Daily   Or   thiamine   100 mg Intravenous Daily   Continuous Infusions:   LOS: 1 day    Time spent: 50 mins    Savannaha Stonerock, MD [facility]   If 7PM-7AM, please contact night-coverage

## 2022-07-15 NOTE — Consult Note (Signed)
Cardiology Consultation:   Patient ID: Tricia Alvarez; [id number]; 1956/02/08   Admit date: 07/14/2022 Date of Consult: 07/15/2022  Primary Care Provider: Kirk Ruths, MD Primary Cardiologist: Tricia Alvarez Electrophysiologist:  Tricia Alvarez   Patient Profile:   Tricia Alvarez is a 67 y.o. female with a hx of coronary artery calcification noted on CT imaging, atrial flutter status post catheter ablation in 10/2018, PAF, solitary kidney with prior right nephrectomy, HTN, hypothyroidism, and prior tobacco use who is being seen today for the evaluation of A-fib with RVR at the request of Tricia Alvarez.  History of Present Illness:   Tricia Alvarez 1 is previously followed by EP in [place], [place].  She was initially diagnosed with atrial flutter in 07/2018, at which time she was on a cruise ship and became presyncopal resulting in a fall and brief LOC.  Records indicated that she was in a narrow complex tachycardia at 149 bpm that did not terminate with Valsalva.  She was treated with adenosine with clear demonstration of atrial flutter waves.  She subsequently underwent cardioversion and followed up with her doctors in [place].  Decision was made to pursue catheter ablation.  In 09/2018, prior to ablation, she was seen in the ED secondary to tachypalpitations and found to be in A-fib.  She was supposed to undergo TEE guided DCCV, however a small left atrial appendage thrombus was noted and DCCV was canceled.  She was placed on apixaban and subsequently converted to sinus rhythm on her own.  She was then placed on Multaq without recurrence of A-fib.  Atrial flutter ablation was successfully carried out in 10/2018.  In early 2021, she underwent Holter monitoring which showed no recurrence of atrial arrhythmias leading Multaq and apixaban to be discontinued.  She established with Dr. Caryl Alvarez in 06/2020, last seeing him in 11/2021 at which time she was without symptoms of angina or decompensation.  Between that  visit and her visit with our office yesterday, she successfully quit smoking and began to note some weight gain over the fall months.  Beginning on the evening of 06/19/2022, she began experiencing intermittent presyncope and exertional dyspnea.  This had been significantly worse over the preceding week leading up to her visit in our office yesterday.  She reported her Fitbit indicated her heart rate was over 100 bpm intermittently.  She saw her OB/GYN on 07/12/2022 and was noted to have an irregular heart rate on exam she was told that she was in atrial fibrillation.  Vital signs indicate heart rate of 73 bpm.  In our office on 07/14/2022, she was in A-fib with RVR with ventricular rate of 172 bpm.  Her Fitbit recorded a heart rate of 76 bpm and was thus felt to be an accurate.  Upon further questioning, in addition to dyspnea on exertion and intermittent presyncope, the preceding night she had tachypalpitations with a pounding sensation in her chest as well as left chest and throat tightness associated with dyspnea.  Given symptoms, she was sent to the ED with stable BP.  She remained in A-fib with RVR with ventricular rates in the 190s bpm on EKG.  Initial high-sensitivity troponin 22 with a delta troponin of 19 subsequently trending to 23.  BNP 708.  TSH normal with elevated free T4.  Potassium 4.0, BUN 14, serum creatinine 0.87, Hgb 13.1.  Chest x-ray notable for patchy and linear opacities in the bilateral lungs possibly representing atelectasis versus pneumonia.  In the ED, she received acetaminophen, azithromycin,  Rocephin, Cardizem injection x 2 of 15 mg and 25 mg respectively, 25 mg Toprol-XL, IV fluids, and was placed on heparin and diltiazem drips.  Currently, she remains in Afib with improved ventricular rates in the 80s to low 100s bpm. Overnight, she did have another episode of chest and throat tightness that improved with Ativan. No chest pain. No falls in the past 12 months. No symptoms concerning for  bleeding.    Past Medical History:  Diagnosis Date   Arthritis    Atrial flutter ([place])    a. 07/2018 - occurred on cruise ship. Flutter waves seen after adenosine->DCCV on ship; b. 10/2018 s/p RFCA.   Cancer ([place])    skin and vulvular   History of cardiac radiofrequency ablation    Hypothyroidism    Osteopenia    Osteoporosis    PAF (paroxysmal atrial fibrillation) ([place])    a. prev on dronedarone; b. 08/2019 Holter: No afib-->eliquis d/c'd.   Renal disorder    right nephrectomy    Past Surgical History:  Procedure Laterality Date   cardiac radiofrequency ablation     CARPAL TUNNEL RELEASE Bilateral    CESAREAN SECTION     COLONOSCOPY WITH PROPOFOL N/A 05/25/2021   Procedure: COLONOSCOPY WITH PROPOFOL;  Surgeon: Toledo, Benay Pike, MD;  Location: [facility] ENDOSCOPY;  Service: Gastroenterology;  Laterality: N/A;     Home Meds: Prior to Admission medications   Medication Sig Start Date End Date Taking? Authorizing Provider  Ascorbic Acid (VITAMIN C) 1000 MG tablet Take 2,000 mg by mouth daily.   Yes [provider]  calcium carbonate (OSCAL) 1500 (600 Ca) MG TABS tablet Take 1 tablet by mouth daily.   Yes [provider]  cholecalciferol (VITAMIN D3) 25 MCG (1000 UNIT) tablet Take 2,000 Units by mouth daily.   Yes [provider]  levothyroxine (SYNTHROID) 75 MCG tablet Take 75 mcg by mouth daily before breakfast.   Yes [provider]  magnesium gluconate (MAGONATE) 500 MG tablet Take 500 mg by mouth daily.   Yes [provider]  Multiple Vitamin (MULTIVITAMIN) tablet Take 1 tablet by mouth daily.   Yes [provider]    Inpatient Medications: Scheduled Meds:  vitamin C  2,000 mg Oral Daily   calcium carbonate  1 tablet Oral Daily   cholecalciferol  2,000 Units Oral Daily   folic acid  1 mg Oral Daily   furosemide  40 mg Intravenous Daily   levothyroxine  50 mcg Oral Q0600   LORazepam  0-4 mg Oral Q6H   Followed by    Derrill Memo ON 07/16/2022] LORazepam  0-4 mg Oral Q12H   magnesium gluconate  500 mg Oral Daily   metoprolol succinate  50 mg Oral Daily   multivitamin with minerals  1 tablet Oral Daily   thiamine  100 mg Oral Daily   Or   thiamine  100 mg Intravenous Daily   Continuous Infusions:  diltiazem (CARDIZEM) infusion 6 mg/hr (07/15/22 0536)   heparin 1,150 Units/hr (07/14/22 1730)   PRN Meds: acetaminophen **OR** acetaminophen, ondansetron **OR** ondansetron (ZOFRAN) IV  Allergies:   Allergies  Allergen Reactions   Morphine And Related Nausea And Vomiting    Nausea  vomiting  headache    Social History:   Social History   Socioeconomic History   Marital status: Divorced    Spouse name: Not on file   Number of children: Not on file   Years of education: Not on file   Highest education level:  Not on file  Occupational History   Not on file  Tobacco Use   Smoking status: Former    Types: Cigarettes    Quit date: 02/27/2022    Years since quitting: 0.3   Smokeless tobacco: Never  Vaping Use   Vaping Use: Never used  Substance and Sexual Activity   Alcohol use: Yes    Comment: socially   Drug use: Never   Sexual activity: Not on file  Other Topics Concern   Not on file  Social History Narrative   Lives locally.  Active without routinely exercising.   Social Determinants of Health   Financial Resource Strain: Not on file  Food Insecurity: Not on file  Transportation Needs: Not on file  Physical Activity: Not on file  Stress: Not on file  Social Connections: Not on file  Intimate Partner Violence: Not on file     Family History:   Family History  Problem Relation Age of Onset   Breast cancer Cousin     ROS:  Review of Systems  Constitutional:  Positive for malaise/fatigue. Negative for chills, diaphoresis, fever and weight loss.  HENT:  Negative for congestion.   Eyes:  Negative for discharge and redness.  Respiratory:  Positive for shortness of breath. Negative  for cough, sputum production and wheezing.   Cardiovascular:  Positive for palpitations. Negative for chest pain, orthopnea, claudication, leg swelling and PND.  Gastrointestinal:  Negative for abdominal pain, blood in stool, heartburn, melena, nausea and vomiting.  Musculoskeletal:  Negative for falls and myalgias.  Skin:  Negative for rash.  Neurological:  Negative for dizziness, tingling, tremors, sensory change, speech change, focal weakness, loss of consciousness and weakness.  Endo/Heme/Allergies:  Does not bruise/bleed easily.  Psychiatric/Behavioral:  Negative for substance abuse. The patient is not nervous/anxious.   All other systems reviewed and are negative.     Physical Exam/Data:   Vitals:   07/15/22 0230 07/15/22 0330 07/15/22 0400 07/15/22 0601  BP: (!) 116/98 119/88 (!) 115/90 (!) 116/94  Pulse: 74 81 81 98  Resp: 16   18  Temp:    97.6 F (36.4 C)  TempSrc:    Oral  SpO2: 95% 95% 93% 95%    Intake/Output Summary (Last 24 hours) at 07/15/2022 0738 Last data filed at 07/14/2022 1915 Gross per 24 hour  Intake 1000 ml  Output --  Net 1000 ml   There were no vitals filed for this visit. There is no height or weight on file to calculate BMI.   Physical Exam: General: Well developed, well nourished, in no acute distress. Head: Normocephalic, atraumatic, sclera non-icteric, no xanthomas, nares without discharge.  Neck: Negative for carotid bruits. JVD mildly elevated. Lungs: Clear bilaterally to auscultation without wheezes, rales, or rhonchi. Breathing is unlabored. Heart: IRIR with S1 S2. No murmurs, rubs, or gallops appreciated. Abdomen: Soft, non-tender, non-distended with normoactive bowel sounds. No hepatomegaly. No rebound/guarding. No obvious abdominal masses. Msk:  Strength and tone appear normal for age. Extremities: No clubbing or cyanosis. No edema. Distal pedal pulses are 2+ and equal bilaterally. Neuro: Alert and oriented X 3. No facial asymmetry. No  focal deficit. Moves all extremities spontaneously. Psych:  Responds to questions appropriately with a normal affect.   EKG:  The EKG was personally reviewed and demonstrates: A-fib with RVR, 191 bpm, left axis deviation, poor R wave progression along the precordial leads, nonspecific ST-T changes Telemetry:  Telemetry was personally reviewed and demonstrates: Afib with ventricular rates in the 80s  to 90s with brief episodes into the low 100s bpm  Weights: There were no vitals filed for this visit.  Relevant CV Studies:  2D echo pending  Laboratory Data:  Chemistry Recent Labs  Lab 07/14/22 1426 07/15/22 0537  NA 135 137  K 4.0 3.9  CL 101 104  CO2 22 22  GLUCOSE 108* 95  BUN 14 12  CREATININE 0.87 0.89  CALCIUM 10.2 9.2  GFRNONAA >60 >60  ANIONGAP 12 11    No results for input(s): "PROT", "ALBUMIN", "AST", "ALT", "ALKPHOS", "BILITOT" in the last 168 hours. Hematology Recent Labs  Lab 07/14/22 1426 07/15/22 0537  WBC 7.4 6.2  RBC 4.30 3.82*  HGB 13.1 11.6*  HCT 40.6 36.5  MCV 94.4 95.5  MCH 30.5 30.4  MCHC 32.3 31.8  RDW 14.6 14.7  PLT 246 206   Cardiac EnzymesNo results for input(s): "TROPONINI" in the last 168 hours. No results for input(s): "TROPIPOC" in the last 168 hours.  BNP Recent Labs  Lab 07/14/22 1918  BNP 708.8*    DDimer No results for input(s): "DDIMER" in the last 168 hours.  Radiology/Studies:  DG Chest 2 View  Result Date: 07/14/2022 IMPRESSION: Patchy and linear opacities in both lung bases, which may represent atelectasis or pneumonia. Electronically Signed   By: Ronney Asters M.D.   On: 07/14/2022 15:05    Assessment and Plan:   1.  A-fib with RVR: -She remains in Afib with improved ventricular rates in the 80s to low 100s bpm -Transition off diltiazem gtt to Cardizem CD 120 mg (overlap for 60 minutes), if needed could add low dose metoprolol or digoxin per MD -If ventricular rates remain well controlled, and she is asymptomatic  would pursue outpatient DCCV after she has been adequately anticoagulated without interruption for 4 weeks -If however, we have difficulty in controlling or rates/symptoms, we may need to pursue TEE-guided DCCV early next week, prior to discharge -Pursue echo with improved ventricular rate -CHA2DS2-VASc 4 (HTN, age x 1, vascular disease, sex category) -Transition from heparin gtt to Eliquis 5 mg bid -Potassium at goal -Replete magnesium as outlined below -TSH normal with mildly elevated free T4  2.  Acute heart failure: -Likely exacerbated by A-fib with RVR -Gentle IV diuresis with close monitoring of urine output and renal function -Also received azithromycin and Rocephin in the ED given chest x-ray concerning for possible pneumonia, PCT may be of benefit, will defer to IM  2.  Coronary artery calcification with elevated high-sensitivity troponin: -CT imaging from 12/2021 notable for left main and three-vessel coronary artery calcification -Mildly elevated and flat trending high-sensitivity troponin likely representative of supply/demand ischemia in the setting of A-fib with RVR and not consistent with ACS -However, patient does have multiple risk factors for ischemic heart disease including known coronary artery calcification, prior tobacco use, HTN, and female status with age 25 -Most recent LDL 16 from 12/2021 with goal being at least less than 50 with documented coronary artery calcification and aortic atherosclerosis -Not currently on statin in the outpatient setting, would recommend initiating this moving forward -Transition from heparin gtt to Eliquis as above -Await echo -If echo is reassuring, would pursue coronary CTA in the outpatient setting following restoration of sinus rhythm for gated study  3.  History of atrial flutter: -Status post catheter ablation in 10/2018 -No evidence of recurrence  4.  HTN: -Blood pressure well-controlled currently -Cardizem as above  5.   Solitary kidney: -Status post prior right nephrectomy -Monitor renal  function  6.  Hypomagnesemia: -Replete to goal 2.0, oral repletion ordered by primary service     For questions or updates, please contact [facility] Please consult [email or web address] for contact info under Cardiology/STEMI.   Signed, Christell Faith, PA-C [place] Pager: [phone] 07/15/2022, 7:38 AM

## 2022-07-15 NOTE — ED Notes (Signed)
Pt placed in hospital bed for comfort.

## 2022-07-15 NOTE — ED Notes (Signed)
ED TO INPATIENT HANDOFF REPORT  ED Nurse Name and Phone #: [phone]  S Name/Age/Gender Tricia Alvarez 67 y.o. female Room/Bed: ED10A/ED10A  Code Status   Code Status: Full Code  Home/SNF/Other Home Patient oriented to: self, place, time, and situation Is this baseline? Yes   Triage Complete: Triage complete  Chief Complaint Atrial fibrillation with rapid ventricular response ([place]) [I48.91]  Triage Note First nurse note: Pt to ED via [place] for afib RVR HR 172. Pt has been symptomatic with HA and dizziness.   Patient to ED via POV sent from [facility] due to patient being in A fib RVR at a rate of 170. Patient states she was some CP and SOB earlier this AM none at this time, intermittent since Monday. Hx of ablation and cardioversion. Patient no longer takes medication for a fib.   Allergies Allergies  Allergen Reactions   Morphine And Related Nausea And Vomiting    Nausea  vomiting  headache    Level of Care/Admitting Diagnosis ED Disposition     ED Disposition  Admit   Condition  --   [place]: [place] [[id number]]  Level of Care: Progressive [102]  Admit to Progressive based on following criteria: CARDIOVASCULAR & THORACIC of moderate stability with acute coronary syndrome symptoms/low risk myocardial infarction/hypertensive urgency/arrhythmias/heart failure potentially compromising stability and stable post cardiovascular intervention patients.  Covid Evaluation: Asymptomatic - no recent exposure (last 10 days) testing not required  Diagnosis: Atrial fibrillation with rapid ventricular response [facility]) [[id number]]  Admitting Physician: Gary Fleet  Attending Physician: AGBATA, TOCHUKWU [[id number]]  Certification:: I certify this patient will need inpatient services for at least 2 midnights  Estimated Length of Stay: 2          B Medical/Surgery History Past Medical History:  Diagnosis Date   Arthritis    Atrial  flutter ([place])    a. 07/2018 - occurred on cruise ship. Flutter waves seen after adenosine->DCCV on ship; b. 10/2018 s/p RFCA.   Cancer ([place])    skin and vulvular   History of cardiac radiofrequency ablation    Hypothyroidism    Osteopenia    Osteoporosis    PAF (paroxysmal atrial fibrillation) ([place])    a. prev on dronedarone; b. 08/2019 Holter: No afib-->eliquis d/c'd.   Renal disorder    right nephrectomy   Past Surgical History:  Procedure Laterality Date   cardiac radiofrequency ablation     CARPAL TUNNEL RELEASE Bilateral    CESAREAN SECTION     COLONOSCOPY WITH PROPOFOL N/A 05/25/2021   Procedure: COLONOSCOPY WITH PROPOFOL;  Surgeon: Toledo, Benay Pike, MD;  Location: [facility] ENDOSCOPY;  Service: Gastroenterology;  Laterality: N/A;     A IV Location/Drains/Wounds Patient Lines/Drains/Airways Status     Active Line/Drains/Airways     Name Placement date Placement time Site Days   Peripheral IV 07/14/22 20 G Left Antecubital 07/14/22  1427  Antecubital  1   Peripheral IV 07/14/22 22 G Anterior;Distal;Left Forearm 07/14/22  1546  Forearm  1            Intake/Output Last 24 hours  Intake/Output Summary (Last 24 hours) at 07/15/2022 1808 Last data filed at 07/14/2022 1915 Gross per 24 hour  Intake 1000 ml  Output --  Net 1000 ml    Labs/Imaging Results for orders placed or performed during the hospital encounter of 07/14/22 (from the past 48 hour(s))  Basic metabolic panel     Status: Abnormal   Collection  Time: 07/14/22  2:26 PM  Result Value Ref Range   Sodium 135 135 - 145 mmol/L   Potassium 4.0 3.5 - 5.1 mmol/L   Chloride 101 98 - 111 mmol/L   CO2 22 22 - 32 mmol/L   Glucose, Bld 108 (H) 70 - 99 mg/dL    Comment: Glucose reference range applies only to samples taken after fasting for at least 8 hours.   BUN 14 8 - 23 mg/dL   Creatinine, Ser 0.87 0.44 - 1.00 mg/dL   Calcium 10.2 8.9 - 10.3 mg/dL   GFR, Estimated >60 >60 mL/min    Comment: (NOTE) Calculated  using the CKD-EPI Creatinine Equation (2021)    Anion gap 12 5 - 15    Comment: Performed at [facility], [address] [place], [place] [address]  CBC     Status: None   Collection Time: 07/14/22  2:26 PM  Result Value Ref Range   WBC 7.4 4.0 - 10.5 K/uL   RBC 4.30 3.87 - 5.11 MIL/uL   Hemoglobin 13.1 12.0 - 15.0 g/dL   HCT 40.6 36.0 - 46.0 %   MCV 94.4 80.0 - 100.0 fL   MCH 30.5 26.0 - 34.0 pg   MCHC 32.3 30.0 - 36.0 g/dL   RDW 14.6 11.5 - 15.5 %   Platelets 246 150 - 400 K/uL   nRBC 0.0 0.0 - 0.2 %    Comment: Performed at [facility], [place], [place] [address]  Troponin I (High Sensitivity)     Status: Abnormal   Collection Time: 07/14/22  2:26 PM  Result Value Ref Range   Troponin I (High Sensitivity) 22 (H) <18 ng/L    Comment: (NOTE) Elevated high sensitivity troponin I (hsTnI) values and significant  changes across serial measurements may suggest ACS but many other  chronic and acute conditions are known to elevate hsTnI results.  Refer to the "Links" section for chest pain algorithms and additional  guidance. Performed at [facility], [address] [place], [place] [address]   TSH     Status: None   Collection Time: 07/14/22  2:26 PM  Result Value Ref Range   TSH 3.347 0.350 - 4.500 uIU/mL    Comment: Performed by a 3rd Generation assay with a functional sensitivity of <=0.01 uIU/mL. Performed at [facility], [address] [place], [place] [address]   T4, free     Status: Abnormal   Collection Time: 07/14/22  2:26 PM  Result Value Ref Range   Free T4 1.42 (H) 0.61 - 1.12 ng/dL    Comment: (NOTE) Biotin ingestion may interfere with free T4 tests. If the results are inconsistent with the TSH level, previous test results, or the clinical presentation, then consider biotin interference. If needed, order repeat testing after stopping biotin. Performed at [facility], [place], [place] [address]   Troponin I (High Sensitivity)     Status: Abnormal   Collection Time: 07/14/22  5:00 PM  Result Value Ref Range   Troponin I (High Sensitivity) 19 (H) <18 ng/L    Comment: (NOTE) Elevated high sensitivity troponin I (hsTnI) values and significant  changes across serial measurements may suggest ACS but many other  chronic and acute conditions are known to elevate hsTnI results.  Refer to the "Links" section for chest pain algorithms and additional  guidance. Performed at [facility], [address], [place], [place] [address]   APTT  Status: None   Collection Time: 07/14/22  5:00 PM  Result Value Ref Range   aPTT 32 24 - 36 seconds    Comment: Performed at [facility], [address] [place], [place] [address]  Protime-INR     Status: Abnormal   Collection Time: 07/14/22  5:00 PM  Result Value Ref Range   Prothrombin Time 16.6 (H) 11.4 - 15.2 seconds   INR 1.4 (H) 0.8 - 1.2    Comment: (NOTE) INR goal varies based on device and disease states. Performed at [facility], [address] [place], [place] [address]   Magnesium     Status: Abnormal   Collection Time: 07/14/22  5:00 PM  Result Value Ref Range   Magnesium 1.4 (L) 1.7 - 2.4 mg/dL    Comment: Performed at [facility], [address] [place], [place] [address]  HIV Antibody (routine testing w rflx)     Status: None   Collection Time: 07/14/22  5:00 PM  Result Value Ref Range   HIV Screen 4th Generation wRfx Non Reactive Non Reactive    Comment: Performed at [facility], [address], [place], [place] [address]  Brain natriuretic peptide     Status: Abnormal   Collection Time: 07/14/22  7:18 PM  Result Value Ref Range   B Natriuretic Peptide 708.8 (H) 0.0 - 100.0 pg/mL    Comment: Performed at [facility], [place], [place] [address]  Troponin I (High Sensitivity)     Status: Abnormal   Collection Time:  07/14/22  7:18 PM  Result Value Ref Range   Troponin I (High Sensitivity) 23 (H) <18 ng/L    Comment: (NOTE) Elevated high sensitivity troponin I (hsTnI) values and significant  changes across serial measurements may suggest ACS but many other  chronic and acute conditions are known to elevate hsTnI results.  Refer to the "Links" section for chest pain algorithms and additional  guidance. Performed at [facility], [place], [place] [address]   Heparin level (unfractionated)     Status: Abnormal   Collection Time: 07/14/22 10:16 PM  Result Value Ref Range   Heparin Unfractionated 0.77 (H) 0.30 - 0.70 IU/mL    Comment: (NOTE) The clinical reportable range upper limit is being lowered to >1.10 to align with the FDA approved guidance for the current laboratory assay.  If heparin results are below expected values, and patient dosage has  been confirmed, suggest follow up testing of antithrombin III levels. Performed at [facility], [address] [place], [place] [address]   CBC     Status: Abnormal   Collection Time: 07/15/22  5:37 AM  Result Value Ref Range   WBC 6.2 4.0 - 10.5 K/uL   RBC 3.82 (L) 3.87 - 5.11 MIL/uL   Hemoglobin 11.6 (L) 12.0 - 15.0 g/dL   HCT 36.5 36.0 - 46.0 %   MCV 95.5 80.0 - 100.0 fL   MCH 30.4 26.0 - 34.0 pg   MCHC 31.8 30.0 - 36.0 g/dL   RDW 14.7 11.5 - 15.5 %   Platelets 206 150 - 400 K/uL   nRBC 0.0 0.0 - 0.2 %    Comment: Performed at [facility], [address] [place], [place] [address]  Basic metabolic panel     Status: None   Collection Time: 07/15/22  5:37 AM  Result Value Ref Range   Sodium 137 135 - 145 mmol/L  Potassium 3.9 3.5 - 5.1 mmol/L   Chloride 104 98 - 111 mmol/L   CO2 22 22 - 32 mmol/L   Glucose, Bld 95 70 - 99 mg/dL    Comment: Glucose reference range applies only to samples taken after fasting for at least 8 hours.   BUN 12 8 - 23 mg/dL   Creatinine, Ser 0.89 0.44 - 1.00  mg/dL   Calcium 9.2 8.9 - 10.3 mg/dL   GFR, Estimated >60 >60 mL/min    Comment: (NOTE) Calculated using the CKD-EPI Creatinine Equation (2021)    Anion gap 11 5 - 15    Comment: Performed at [facility], [address] [place], [place] [address]  Procalcitonin - Baseline     Status: None   Collection Time: 07/15/22  5:37 AM  Result Value Ref Range   Procalcitonin <0.10 ng/mL    Comment:        Interpretation: PCT (Procalcitonin) <= 0.5 ng/mL: Systemic infection (sepsis) is not likely. Local bacterial infection is possible. (NOTE)       Sepsis PCT Algorithm           Lower Respiratory Tract                                      Infection PCT Algorithm    ----------------------------     ----------------------------         PCT < 0.25 ng/mL                PCT < 0.10 ng/mL          Strongly encourage             Strongly discourage   discontinuation of antibiotics    initiation of antibiotics    ----------------------------     -----------------------------       PCT 0.25 - 0.50 ng/mL            PCT 0.10 - 0.25 ng/mL               OR       >80% decrease in PCT            Discourage initiation of                                            antibiotics      Encourage discontinuation           of antibiotics    ----------------------------     -----------------------------         PCT >= 0.50 ng/mL              PCT 0.26 - 0.50 ng/mL               AND        <80% decrease in PCT             Encourage initiation of                                             antibiotics       Encourage continuation           of antibiotics    ----------------------------     -----------------------------  PCT >= 0.50 ng/mL                  PCT > 0.50 ng/mL               AND         increase in PCT                  Strongly encourage                                      initiation of antibiotics    Strongly encourage escalation           of antibiotics                                      -----------------------------                                           PCT <= 0.25 ng/mL                                                 OR                                        > 80% decrease in PCT                                      Discontinue / Do not initiate                                             antibiotics  Performed at [facility], [address] [place], [place] [address]    ECHOCARDIOGRAM COMPLETE  Result Date: 07/15/2022    ECHOCARDIOGRAM REPORT   Patient Name:   [facility] Date of Exam: 07/15/2022 Medical Rec #:  [id number]       Height:       67.0 in Accession #:    [id number]      Weight:       169.0 lb Date of Birth:  01-Aug-1955        BSA:          1.883 m Patient Age:    63 years        BP:           116/98 mmHg Patient Gender: F               HR:           98 bpm. Exam Location:  [facility] Procedure: 2D Echo, Color Doppler, Cardiac Doppler and Intracardiac            Opacification Agent Indications:     Atrial Fibrillation  History:         Patient has no prior  history of Echocardiogram examinations.                  Arrythmias:Atrial Fibrillation.  Sonographer:     L. Thornton-Maynard Referring Phys:  [id number] AGBATA Diagnosing Phys: Ida Rogue MD  Sonographer Comments: Suboptimal apical window. IMPRESSIONS  1. Left ventricular ejection fraction, by estimation, is 30 to 35%. The left ventricle has moderately decreased function. The left ventricle demonstrates global hypokinesis. Left ventricular diastolic parameters are indeterminate.  2. Right ventricular systolic function is mildly reduced. The right ventricular size is moderately enlarged. There is normal pulmonary artery systolic pressure. The estimated right ventricular systolic pressure is 42.5 mmHg.  3. Left atrial size was moderately dilated.  4. Right atrial size was moderately dilated.  5. The mitral valve is normal in structure. Moderate mitral valve regurgitation. No evidence of  mitral stenosis.  6. Tricuspid valve regurgitation is moderate to severe.  7. The aortic valve is normal in structure. Aortic valve regurgitation is mild. Aortic valve sclerosis is present, with no evidence of aortic valve stenosis.  8. There is borderline dilatation of the ascending aorta, measuring 38 mm.  9. The inferior vena cava is normal in size with greater than 50% respiratory variability, suggesting right atrial pressure of 3 mmHg. FINDINGS  Left Ventricle: Left ventricular ejection fraction, by estimation, is 30 to 35%. The left ventricle has moderately decreased function. The left ventricle demonstrates global hypokinesis. Definity contrast agent was given IV to delineate the left ventricular endocardial borders. The left ventricular internal cavity size was normal in size. There is no left ventricular hypertrophy. Left ventricular diastolic parameters are indeterminate. Right Ventricle: The right ventricular size is moderately enlarged. No increase in right ventricular wall thickness. Right ventricular systolic function is mildly reduced. There is normal pulmonary artery systolic pressure. The tricuspid regurgitant velocity is 2.34 m/s, and with an assumed right atrial pressure of 8 mmHg, the estimated right ventricular systolic pressure is 95.6 mmHg. Left Atrium: Left atrial size was moderately dilated. Right Atrium: Right atrial size was moderately dilated. Pericardium: There is no evidence of pericardial effusion. Mitral Valve: The mitral valve is normal in structure. Moderate mitral valve regurgitation. No evidence of mitral valve stenosis. MV peak gradient, 3.5 mmHg. The mean mitral valve gradient is 1.5 mmHg. Tricuspid Valve: The tricuspid valve is normal in structure. Tricuspid valve regurgitation is moderate to severe. No evidence of tricuspid stenosis. Aortic Valve: The aortic valve is normal in structure. Aortic valve regurgitation is mild. Aortic valve sclerosis is present, with no evidence of  aortic valve stenosis. Aortic valve mean gradient measures 2.0 mmHg. Aortic valve peak gradient measures 4.0  mmHg. Aortic valve area, by VTI measures 2.01 cm. Pulmonic Valve: The pulmonic valve was normal in structure. Pulmonic valve regurgitation is trivial. No evidence of pulmonic stenosis. Aorta: The aortic root is normal in size and structure. There is borderline dilatation of the ascending aorta, measuring 38 mm. Venous: The inferior vena cava is normal in size with greater than 50% respiratory variability, suggesting right atrial pressure of 3 mmHg. IAS/Shunts: No atrial level shunt detected by color flow Doppler.  LEFT VENTRICLE PLAX 2D LVIDd:         5.00 cm      Diastology LVIDs:         4.40 cm      LV e' medial:    7.94 cm/s LV PW:         0.70 cm      LV E/e'  medial:  10.3 LV IVS:        1.10 cm      LV e' lateral:   11.10 cm/s LVOT diam:     2.10 cm      LV E/e' lateral: 7.4 LV SV:         27 LV SV Index:   14 LVOT Area:     3.46 cm  LV Volumes (MOD) LV vol d, MOD A2C: 106.0 ml LV vol d, MOD A4C: 55.4 ml LV vol s, MOD A2C: 50.5 ml LV vol s, MOD A4C: 37.3 ml LV SV MOD A2C:     55.5 ml LV SV MOD A4C:     55.4 ml LV SV MOD BP:      31.5 ml RIGHT VENTRICLE            IVC RV Basal diam:  4.40 cm    IVC diam: 3.10 cm RV S prime:     7.40 cm/s TAPSE (M-mode): 1.1 cm LEFT ATRIUM              Index        RIGHT ATRIUM           Index LA diam:        4.10 cm  2.18 cm/m   RA Area:     30.40 cm LA Vol (A2C):   108.0 ml 57.37 ml/m  RA Volume:   109.00 ml 57.90 ml/m LA Vol (A4C):   95.2 ml  50.57 ml/m LA Biplane Vol: 103.0 ml 54.71 ml/m  AORTIC VALVE                    PULMONIC VALVE AV Area (Vmax):    1.94 cm     PV Vmax:          0.49 m/s AV Area (Vmean):   1.88 cm     PV Peak grad:     1.0 mmHg AV Area (VTI):     2.01 cm     PR End Diast Vel: 5.02 msec AV Vmax:           99.50 cm/s AV Vmean:          67.600 cm/s AV VTI:            0.132 m AV Peak Grad:      4.0 mmHg AV Mean Grad:      2.0 mmHg LVOT  Vmax:         55.80 cm/s LVOT Vmean:        36.700 cm/s LVOT VTI:          0.077 m LVOT/AV VTI ratio: 0.58  AORTA Ao Root diam: 3.30 cm Ao Asc diam:  3.80 cm MITRAL VALVE                  TRICUSPID VALVE MV Area (PHT): 6.83 cm       TR Peak grad:   21.9 mmHg MV Area VTI:   2.13 cm       TR Vmax:        234.00 cm/s MV Peak grad:  3.5 mmHg MV Mean grad:  1.5 mmHg       SHUNTS MV Vmax:       0.93 m/s       Systemic VTI:  0.08 m MV Vmean:      64.0 cm/s      Systemic Diam: 2.10 cm MV Decel Time: 111 msec MR Peak grad:  88.4 mmHg MR Mean grad:    58.7 mmHg MR Vmax:         470.00 cm/s MR Vmean:        364.0 cm/s MR PISA:         2.02 cm MR PISA Eff ROA: 17 mm MR PISA Radius:  0.57 cm MV E velocity: 82.00 cm/s Ida Rogue MD Electronically signed by Ida Rogue MD Signature Date/Time: 07/15/2022/2:15:05 PM    Final    DG Chest 2 View  Result Date: 07/14/2022 CLINICAL DATA:  Chest pain EXAM: CHEST - 2 VIEW COMPARISON:  Chest x-ray 01/20/2022 FINDINGS: There is some patchy and linear opacities in both lung bases. The heart is mildly enlarged. There is no pleural effusion or pneumothorax. No acute fractures are identified. IMPRESSION: Patchy and linear opacities in both lung bases, which may represent atelectasis or pneumonia. Electronically Signed   By: Ronney Asters M.D.   On: 07/14/2022 15:05    Pending Labs Unresulted Labs (From admission, onward)    None       Vitals/Pain Today's Vitals   07/15/22 1251 07/15/22 1400 07/15/22 1513 07/15/22 1626  BP: 108/83 (!) 108/90 104/88 (!) 114/92  Pulse:   74 95  Resp: '19 19 19   '$ Temp: 97.7 F (36.5 C)     TempSrc:      SpO2: 95% 95% 96%   PainSc:        Isolation Precautions No active isolations  Medications Medications  furosemide (LASIX) injection 40 mg (40 mg Intravenous Given 07/15/22 1029)  ascorbic acid (VITAMIN C) tablet 2,000 mg (2,000 mg Oral Given 07/15/22 1029)  calcium carbonate (OS-CAL - dosed in mg of elemental calcium)  tablet 1,250 mg (1,250 mg Oral Given 07/15/22 1030)  cholecalciferol (VITAMIN D3) 25 MCG (1000 UNIT) tablet 2,000 Units (2,000 Units Oral Given 07/15/22 1029)  magnesium gluconate (MAGONATE) tablet 500 mg (500 mg Oral Given 07/15/22 1029)  multivitamin with minerals tablet 1 tablet (1 tablet Oral Given 07/15/22 1029)  levothyroxine (SYNTHROID) tablet 50 mcg (50 mcg Oral Given 07/15/22 1030)  acetaminophen (TYLENOL) tablet 650 mg (650 mg Oral Given 07/14/22 1803)    Or  acetaminophen (TYLENOL) suppository 650 mg ( Rectal See Alternative 07/14/22 1803)  ondansetron (ZOFRAN) tablet 4 mg (has no administration in time range)    Or  ondansetron (ZOFRAN) injection 4 mg (has no administration in time range)  thiamine (VITAMIN B1) tablet 100 mg (100 mg Oral Given 07/15/22 1029)    Or  thiamine (VITAMIN B1) injection 100 mg ( Intravenous See Alternative 10/07/22 4010)  folic acid (FOLVITE) tablet 1 mg (1 mg Oral Given 07/15/22 1029)  LORazepam (ATIVAN) tablet 0-4 mg ( Oral Not Given 07/15/22 1626)    Followed by  LORazepam (ATIVAN) tablet 0-4 mg (has no administration in time range)  apixaban (ELIQUIS) tablet 5 mg (5 mg Oral Given 07/15/22 1030)  metoprolol succinate (TOPROL-XL) 24 hr tablet 50 mg (has no administration in time range)  diltiazem (CARDIZEM) injection 15 mg (15 mg Intravenous Given 07/14/22 1430)  sodium chloride 0.9 % bolus 1,000 mL (0 mLs Intravenous Stopped 07/14/22 1915)  diltiazem (CARDIZEM) injection 25 mg (25 mg Intravenous Given 07/14/22 1449)  cefTRIAXone (ROCEPHIN) 1 g in sodium chloride 0.9 % 100 mL IVPB (0 g Intravenous Stopped 07/14/22 1625)  azithromycin (ZITHROMAX) 500 mg in sodium chloride 0.9 % 250 mL IVPB (0 mg Intravenous Stopped 07/14/22 1727)  heparin bolus via infusion 4,600 Units (4,600 Units Intravenous Bolus from Bag  07/14/22 1735)    Mobility walks     Focused Assessments    R Recommendations: See Admitting Provider Note  Report given to:   Additional Notes:  please call ed for questions

## 2022-07-16 DIAGNOSIS — E039 Hypothyroidism, unspecified: Secondary | ICD-10-CM | POA: Diagnosis not present

## 2022-07-16 DIAGNOSIS — I429 Cardiomyopathy, unspecified: Secondary | ICD-10-CM

## 2022-07-16 DIAGNOSIS — I4891 Unspecified atrial fibrillation: Secondary | ICD-10-CM | POA: Diagnosis not present

## 2022-07-16 DIAGNOSIS — I10 Essential (primary) hypertension: Secondary | ICD-10-CM | POA: Diagnosis not present

## 2022-07-16 DIAGNOSIS — I5031 Acute diastolic (congestive) heart failure: Secondary | ICD-10-CM | POA: Diagnosis not present

## 2022-07-16 LAB — BASIC METABOLIC PANEL
Anion gap: 11 (ref 5–15)
BUN: 15 mg/dL (ref 8–23)
CO2: 26 mmol/L (ref 22–32)
Calcium: 9.1 mg/dL (ref 8.9–10.3)
Chloride: 102 mmol/L (ref 98–111)
Creatinine, Ser: 0.96 mg/dL (ref 0.44–1.00)
GFR, Estimated: >60 mL/min (ref >60)
Glucose, Bld: 95 mg/dL (ref 70–99)
Potassium: 3.5 mmol/L (ref 3.5–5.1)
Sodium: 139 mmol/L (ref 135–145)

## 2022-07-16 MED ORDER — DIGOXIN 250 MCG PO TABS
0.2500 mg | ORAL_TABLET | Freq: Every day | ORAL | Status: DC
  Administered 2022-07-17 – 2022-07-18 (×2): 0.25 mg via ORAL
  Filled 2022-07-16 (×2): qty 1

## 2022-07-16 MED ORDER — DIGOXIN 0.25 MG/ML IJ SOLN
0.2500 mg | INTRAMUSCULAR | Status: AC
  Administered 2022-07-16 (×2): 0.25 mg via INTRAVENOUS
  Filled 2022-07-16 (×2): qty 2

## 2022-07-16 MED ORDER — ATORVASTATIN CALCIUM 20 MG PO TABS
40.0000 mg | ORAL_TABLET | Freq: Every day | ORAL | Status: DC
  Administered 2022-07-16 – 2022-07-19 (×3): 40 mg via ORAL
  Filled 2022-07-16 (×4): qty 2

## 2022-07-16 MED ORDER — METOPROLOL SUCCINATE ER 50 MG PO TB24
50.0000 mg | ORAL_TABLET | Freq: Two times a day (BID) | ORAL | Status: DC
  Administered 2022-07-16 – 2022-07-17 (×2): 50 mg via ORAL
  Filled 2022-07-16: qty 1

## 2022-07-16 MED ORDER — POTASSIUM CHLORIDE 20 MEQ PO PACK
40.0000 meq | PACK | Freq: Once | ORAL | Status: AC
  Administered 2022-07-16: 40 meq via ORAL
  Filled 2022-07-16: qty 2

## 2022-07-16 NOTE — Progress Notes (Signed)
PROGRESS NOTE    Aliyana Dlugosz  PJK:[id number] DOB: 04/07/1956 DOA: 07/14/2022  PCP: Kirk Ruths, MD Brief Narrative:  This 67 y.o. female with medical history significant for atrial fibrillation treated in the past with dronedarone and then catheter ablation (Eliquis and metoprolol were discontinued on03/21), history of hypothyroidism, alcohol dependence, status post nephrectomy who presents to the ER from the cardiology clinic for evaluation of rapid A-fib. She was noted to have heart rate in the 170s during her cardiology appointment.  Patient has developed palpitation associated with exertional shortness of breath for last 5 days.  She was seen by gynecologist 3-day prior to admission and was advised to follow-up with cardiologist because her physical exam consistent with irregular tachycardia.  Patient also developed orthopnea, not able to lie flat and sleeping in the recliner.  She has lower extremity swelling and has gained 10 pounds in last 1 month.  EKG shows A-fib with RVR.  Patient was given 2 doses of IV Cardizem without any improvement.  Subsequently she was started on Cardizem drip.  Admitted for further evaluation.  Cardiology is consulted.  Assessment & Plan:   Principal Problem:   Atrial fibrillation with RVR (HCC) Active Problems:   Acute diastolic CHF (congestive heart failure) (HCC)   Hypothyroidism   Alcohol dependence ([place])   Essential hypertension   History of atrial flutter  Atrial fibrillation with RVR: Patient with PMH of atrial fibrillation s/p catheter ablation and has been off antiarrhythmic agents for several years as well as Eliquis. She presented in the ED for the evaluation of exertional shortness of breath, palpitations, dizziness and intermittent chest discomfort. She was found to be tachycardic with heart rate in 170s and EKG showed rapid A-fib. She received 2 doses of IV Cardizem without improvement in her heart rate and started on Cardizem  drip Cardizem discontinued given cardiomyopathy showing low LVEF 30 to 35%. Started on metoprolol 50 mg every 12 hours for heart rate control. 2D echocardiogram shows EF 30 to 35%.  LV function mildly decreased. Cardiology is consulted. Patient started on Eliquis.   She continued to have palpitations while ambulation where heart rate up to 120s. Cardiology started digoxin in addition to metoprolol. If symptoms improved tomorrow she can be discharged but if continues to remain symptomatic may require cardioversion.   ETOH depandence: Patient admits to daily alcohol use but denies having symptoms of alcohol withdrawal when she does not drink. Continue CIWA protocol and administer lorazepam as needed. Continue thiamine, folic acid and multivitamins.   Hypothyroidism: Patient has been on Synthroid 75 mcg and labs show normal TSH with elevated free thyroxine level This could also be contributing to her rapid A-fib Decrease Synthroid to 50 mcg daily Repeat TSH and free thyroxine levels in 6 to 8 weeks.   Acute on chronic systolic CHF. Most likely related to A-fib with RVR. Symptoms include exertional shortness of breath, 3 pillow orthopnea and bilateral lower extremity swelling 2D echocardiogram shows LVEF 30 to 35%. Continue Lasix 40 mg IV daily Continue metoprolol 50 mg twice daily.  Cardizem discontinued. Cardiology is consulted.  Medication adjusted.   Essential hypertension.: Blood pressure is controlled.  Continue metoprolol.  Solitary kidney: S/p right nephrectomy Continue to monitor renal functions.  DVT prophylaxis: Eliquis Code Status: Full code Family Communication: No family at bed side Disposition Plan:   Status is: Inpatient Remains inpatient appropriate because: Admitted for A-fib with RVR requiring Cardizem gtt. and started on Eliquis. Patient continued to have A-fib started  on digoxin in addition to metoprolol.  Consultants:   Cardiology  Procedures:TTE  Antimicrobials: None Subjective: Patient examined at bedside.  Overnight events noted.   Patient reports having palpitations while walking in the hallway.  Telemetry showed heart rate goes to 125. She denies any chest pain or shortness of breath.   Objective: Vitals:   07/15/22 2048 07/15/22 2314 07/16/22 0524 07/16/22 0743  BP: (!) 120/102 (!) 113/99 108/77 (!) 119/97  Pulse: 97 87 (!) 101 (!) 112  Resp: '18 18 18 16  '$ Temp: 97.7 F (36.5 C) 97.7 F (36.5 C) 97.7 F (36.5 C) 98 F (36.7 C)  TempSrc:   Oral Oral  SpO2: 96% 93% 95% 100%   No intake or output data in the 24 hours ending 07/16/22 1151  There were no vitals filed for this visit.  Examination:  General exam: Appears comfortable, not in any acute distress.  Anxious Respiratory system: CTA bilaterally, respiratory effort normal, RR 15. Cardiovascular system: S1 & S2 heard, Irregular rhythm, no murmur. Gastrointestinal system: Abdomen is soft, non tender, non distended, BS+ Central nervous system: Alert and oriented x 3. No focal neurological deficits. Extremities: Edema+, no cyanosis, no clubbing Skin: No rashes, lesions or ulcers Psychiatry: Judgement and insight appear normal. Mood & affect appropriate.     Data Reviewed: I have personally reviewed following labs and imaging studies  CBC: Recent Labs  Lab 07/14/22 1426 07/15/22 0537  WBC 7.4 6.2  HGB 13.1 11.6*  HCT 40.6 36.5  MCV 94.4 95.5  PLT 246 644   Basic Metabolic Panel: Recent Labs  Lab 07/14/22 1426 07/14/22 1700 07/15/22 0537  NA 135  --  137  K 4.0  --  3.9  CL 101  --  104  CO2 22  --  22  GLUCOSE 108*  --  95  BUN 14  --  12  CREATININE 0.87  --  0.89  CALCIUM 10.2  --  9.2  MG  --  1.4*  --    GFR: Estimated Creatinine Clearance: 67.3 mL/min (by C-G formula based on SCr of 0.89 mg/dL). Liver Function Tests: No results for input(s): "AST", "ALT", "ALKPHOS", "BILITOT", "PROT", "ALBUMIN" in the  last 168 hours. No results for input(s): "LIPASE", "AMYLASE" in the last 168 hours. No results for input(s): "AMMONIA" in the last 168 hours. Coagulation Profile: Recent Labs  Lab 07/14/22 1700  INR 1.4*   Cardiac Enzymes: No results for input(s): "CKTOTAL", "CKMB", "CKMBINDEX", "TROPONINI" in the last 168 hours. BNP (last 3 results) No results for input(s): "PROBNP" in the last 8760 hours. HbA1C: No results for input(s): "HGBA1C" in the last 72 hours. CBG: No results for input(s): "GLUCAP" in the last 168 hours. Lipid Profile: No results for input(s): "CHOL", "HDL", "LDLCALC", "TRIG", "CHOLHDL", "LDLDIRECT" in the last 72 hours. Thyroid Function Tests: Recent Labs    07/14/22 1426  TSH 3.347  FREET4 1.42*   Anemia Panel: No results for input(s): "VITAMINB12", "FOLATE", "FERRITIN", "TIBC", "IRON", "RETICCTPCT" in the last 72 hours. Sepsis Labs: Recent Labs  Lab 07/15/22 0537  PROCALCITON <0.10    No results found for this or any previous visit (from the past 240 hour(s)).    Radiology Studies: ECHOCARDIOGRAM COMPLETE  Result Date: 07/15/2022    ECHOCARDIOGRAM REPORT   Patient Name:   [facility] Date of Exam: 07/15/2022 Medical Rec #:  [id number]       Height:       67.0 in Accession #:  [id number]      Weight:       169.0 lb Date of Birth:  05/28/1956        BSA:          1.883 m Patient Age:    36 years        BP:           116/98 mmHg Patient Gender: F               HR:           98 bpm. Exam Location:  [facility] Procedure: 2D Echo, Color Doppler, Cardiac Doppler and Intracardiac            Opacification Agent Indications:     Atrial Fibrillation  History:         Patient has no prior history of Echocardiogram examinations.                  Arrythmias:Atrial Fibrillation.  Sonographer:     L. Thornton-Maynard Referring Phys:  [id number] VHQIONGE AGBATA Diagnosing Phys: Ida Rogue MD  Sonographer Comments: Suboptimal apical window. IMPRESSIONS  1. Left ventricular ejection  fraction, by estimation, is 30 to 35%. The left ventricle has moderately decreased function. The left ventricle demonstrates global hypokinesis. Left ventricular diastolic parameters are indeterminate.  2. Right ventricular systolic function is mildly reduced. The right ventricular size is moderately enlarged. There is normal pulmonary artery systolic pressure. The estimated right ventricular systolic pressure is 95.2 mmHg.  3. Left atrial size was moderately dilated.  4. Right atrial size was moderately dilated.  5. The mitral valve is normal in structure. Moderate mitral valve regurgitation. No evidence of mitral stenosis.  6. Tricuspid valve regurgitation is moderate to severe.  7. The aortic valve is normal in structure. Aortic valve regurgitation is mild. Aortic valve sclerosis is present, with no evidence of aortic valve stenosis.  8. There is borderline dilatation of the ascending aorta, measuring 38 mm.  9. The inferior vena cava is normal in size with greater than 50% respiratory variability, suggesting right atrial pressure of 3 mmHg. FINDINGS  Left Ventricle: Left ventricular ejection fraction, by estimation, is 30 to 35%. The left ventricle has moderately decreased function. The left ventricle demonstrates global hypokinesis. Definity contrast agent was given IV to delineate the left ventricular endocardial borders. The left ventricular internal cavity size was normal in size. There is no left ventricular hypertrophy. Left ventricular diastolic parameters are indeterminate. Right Ventricle: The right ventricular size is moderately enlarged. No increase in right ventricular wall thickness. Right ventricular systolic function is mildly reduced. There is normal pulmonary artery systolic pressure. The tricuspid regurgitant velocity is 2.34 m/s, and with an assumed right atrial pressure of 8 mmHg, the estimated right ventricular systolic pressure is 84.1 mmHg. Left Atrium: Left atrial size was moderately  dilated. Right Atrium: Right atrial size was moderately dilated. Pericardium: There is no evidence of pericardial effusion. Mitral Valve: The mitral valve is normal in structure. Moderate mitral valve regurgitation. No evidence of mitral valve stenosis. MV peak gradient, 3.5 mmHg. The mean mitral valve gradient is 1.5 mmHg. Tricuspid Valve: The tricuspid valve is normal in structure. Tricuspid valve regurgitation is moderate to severe. No evidence of tricuspid stenosis. Aortic Valve: The aortic valve is normal in structure. Aortic valve regurgitation is mild. Aortic valve sclerosis is present, with no evidence of aortic valve stenosis. Aortic valve mean gradient measures 2.0 mmHg. Aortic valve peak gradient measures 4.0  mmHg. Aortic valve  area, by VTI measures 2.01 cm. Pulmonic Valve: The pulmonic valve was normal in structure. Pulmonic valve regurgitation is trivial. No evidence of pulmonic stenosis. Aorta: The aortic root is normal in size and structure. There is borderline dilatation of the ascending aorta, measuring 38 mm. Venous: The inferior vena cava is normal in size with greater than 50% respiratory variability, suggesting right atrial pressure of 3 mmHg. IAS/Shunts: No atrial level shunt detected by color flow Doppler.  LEFT VENTRICLE PLAX 2D LVIDd:         5.00 cm      Diastology LVIDs:         4.40 cm      LV e' medial:    7.94 cm/s LV PW:         0.70 cm      LV E/e' medial:  10.3 LV IVS:        1.10 cm      LV e' lateral:   11.10 cm/s LVOT diam:     2.10 cm      LV E/e' lateral: 7.4 LV SV:         27 LV SV Index:   14 LVOT Area:     3.46 cm  LV Volumes (MOD) LV vol d, MOD A2C: 106.0 ml LV vol d, MOD A4C: 55.4 ml LV vol s, MOD A2C: 50.5 ml LV vol s, MOD A4C: 37.3 ml LV SV MOD A2C:     55.5 ml LV SV MOD A4C:     55.4 ml LV SV MOD BP:      31.5 ml RIGHT VENTRICLE            IVC RV Basal diam:  4.40 cm    IVC diam: 3.10 cm RV S prime:     7.40 cm/s TAPSE (M-mode): 1.1 cm LEFT ATRIUM              Index         RIGHT ATRIUM           Index LA diam:        4.10 cm  2.18 cm/m   RA Area:     30.40 cm LA Vol (A2C):   108.0 ml 57.37 ml/m  RA Volume:   109.00 ml 57.90 ml/m LA Vol (A4C):   95.2 ml  50.57 ml/m LA Biplane Vol: 103.0 ml 54.71 ml/m  AORTIC VALVE                    PULMONIC VALVE AV Area (Vmax):    1.94 cm     PV Vmax:          0.49 m/s AV Area (Vmean):   1.88 cm     PV Peak grad:     1.0 mmHg AV Area (VTI):     2.01 cm     PR End Diast Vel: 5.02 msec AV Vmax:           99.50 cm/s AV Vmean:          67.600 cm/s AV VTI:            0.132 m AV Peak Grad:      4.0 mmHg AV Mean Grad:      2.0 mmHg LVOT Vmax:         55.80 cm/s LVOT Vmean:        36.700 cm/s LVOT VTI:          0.077 m LVOT/AV VTI ratio: 0.58  AORTA Ao Root diam: 3.30 cm Ao Asc diam:  3.80 cm MITRAL VALVE                  TRICUSPID VALVE MV Area (PHT): 6.83 cm       TR Peak grad:   21.9 mmHg MV Area VTI:   2.13 cm       TR Vmax:        234.00 cm/s MV Peak grad:  3.5 mmHg MV Mean grad:  1.5 mmHg       SHUNTS MV Vmax:       0.93 m/s       Systemic VTI:  0.08 m MV Vmean:      64.0 cm/s      Systemic Diam: 2.10 cm MV Decel Time: 111 msec MR Peak grad:    88.4 mmHg MR Mean grad:    58.7 mmHg MR Vmax:         470.00 cm/s MR Vmean:        364.0 cm/s MR PISA:         2.02 cm MR PISA Eff ROA: 17 mm MR PISA Radius:  0.57 cm MV E velocity: 82.00 cm/s Ida Rogue MD Electronically signed by Ida Rogue MD Signature Date/Time: 07/15/2022/2:15:05 PM    Final    DG Chest 2 View  Result Date: 07/14/2022 CLINICAL DATA:  Chest pain EXAM: CHEST - 2 VIEW COMPARISON:  Chest x-ray 01/20/2022 FINDINGS: There is some patchy and linear opacities in both lung bases. The heart is mildly enlarged. There is no pleural effusion or pneumothorax. No acute fractures are identified. IMPRESSION: Patchy and linear opacities in both lung bases, which may represent atelectasis or pneumonia. Electronically Signed   By: Ronney Asters M.D.   On: 07/14/2022 15:05     Scheduled Meds:  apixaban  5 mg Oral BID   vitamin C  2,000 mg Oral Daily   atorvastatin  40 mg Oral Daily   calcium carbonate  1 tablet Oral Daily   cholecalciferol  2,000 Units Oral Daily   digoxin  0.25 mg Intravenous Q4H   [START ON 07/17/2022] digoxin  0.25 mg Oral Daily   folic acid  1 mg Oral Daily   furosemide  40 mg Intravenous Daily   levothyroxine  50 mcg Oral Q0600   LORazepam  0-4 mg Oral Q6H   Followed by   LORazepam  0-4 mg Oral Q12H   magnesium gluconate  500 mg Oral Daily   metoprolol succinate  50 mg Oral BID   multivitamin with minerals  1 tablet Oral Daily   thiamine  100 mg Oral Daily   Or   thiamine  100 mg Intravenous Daily   Continuous Infusions:   LOS: 2 days    Time spent: 35 mins    Ishaan Villamar, MD [facility]   If 7PM-7AM, please contact night-coverage

## 2022-07-16 NOTE — Progress Notes (Signed)
Progress Note  Patient Name: Tricia Alvarez Date of Encounter: 07/16/2022  Primary Cardiologist: Caryl Comes  Subjective   Echo this admission demonstrated an EF of 30 to 35% with global hypokinesis, mildly reduced RV systolic function with moderately enlarged ventricular cavity size, normal PASP, moderate biatrial enlargement, moderate mitral regurgitation, moderate to severe tricuspid regurgitation, aortic valve sclerosis without evidence of stenosis, borderline dilatation of the ascending aorta measuring 38 mm, and an estimated right atrial pressure of 3 mmHg.  Continues to note palpitations with ambulation in the hallway with ventriculars into the 120s bpm. No frank chest pain, dizziness, presyncope, or syncope.   Inpatient Medications    Scheduled Meds:  apixaban  5 mg Oral BID   vitamin C  2,000 mg Oral Daily   calcium carbonate  1 tablet Oral Daily   cholecalciferol  2,000 Units Oral Daily   folic acid  1 mg Oral Daily   furosemide  40 mg Intravenous Daily   levothyroxine  50 mcg Oral Q0600   LORazepam  0-4 mg Oral Q6H   Followed by   LORazepam  0-4 mg Oral Q12H   magnesium gluconate  500 mg Oral Daily   metoprolol succinate  50 mg Oral Daily   multivitamin with minerals  1 tablet Oral Daily   thiamine  100 mg Oral Daily   Or   thiamine  100 mg Intravenous Daily   Continuous Infusions:  PRN Meds: acetaminophen **OR** acetaminophen, ondansetron **OR** ondansetron (ZOFRAN) IV   Vital Signs    Vitals:   07/15/22 1820 07/15/22 2048 07/15/22 2314 07/16/22 0524  BP: (!) 117/92 (!) 120/102 (!) 113/99 108/77  Pulse: 99 97 87 (!) 101  Resp: '18 18 18 18  '$ Temp: 97.7 F (36.5 C) 97.7 F (36.5 C) 97.7 F (36.5 C) 97.7 F (36.5 C)  TempSrc: Oral   Oral  SpO2: 95% 96% 93% 95%   No intake or output data in the 24 hours ending 07/16/22 0734 There were no vitals filed for this visit.  Telemetry    Afib with RVR with ventricular rates in the low 100s to 120s bpm -  Personally Reviewed  ECG    No new tracings - Personally Reviewed  Physical Exam   GEN: No acute distress.   Neck: No JVD. Cardiac: Tachycardic, IRIR, no murmurs, rubs, or gallops.  Respiratory: Clear to auscultation bilaterally.  GI: Soft, nontender, non-distended.   MS: No edema; No deformity. Neuro:  Alert and oriented x 3; Nonfocal.  Psych: Normal affect.  Labs    Chemistry Recent Labs  Lab 07/14/22 1426 07/15/22 0537  NA 135 137  K 4.0 3.9  CL 101 104  CO2 22 22  GLUCOSE 108* 95  BUN 14 12  CREATININE 0.87 0.89  CALCIUM 10.2 9.2  GFRNONAA >60 >60  ANIONGAP 12 11     Hematology Recent Labs  Lab 07/14/22 1426 07/15/22 0537  WBC 7.4 6.2  RBC 4.30 3.82*  HGB 13.1 11.6*  HCT 40.6 36.5  MCV 94.4 95.5  MCH 30.5 30.4  MCHC 32.3 31.8  RDW 14.6 14.7  PLT 246 206    Cardiac EnzymesNo results for input(s): "TROPONINI" in the last 168 hours. No results for input(s): "TROPIPOC" in the last 168 hours.   BNP Recent Labs  Lab 07/14/22 1918  BNP 708.8*     DDimer No results for input(s): "DDIMER" in the last 168 hours.   Radiology    DG Chest 2 View  Result Date:  07/14/2022 IMPRESSION: Patchy and linear opacities in both lung bases, which may represent atelectasis or pneumonia. Electronically Signed   By: Ronney Asters M.D.   On: 07/14/2022 15:05    Cardiac Studies   2D echo 07/15/2022: 1. Left ventricular ejection fraction, by estimation, is 30 to 35%. The  left ventricle has moderately decreased function. The left ventricle  demonstrates global hypokinesis. Left ventricular diastolic parameters are  indeterminate.   2. Right ventricular systolic function is mildly reduced. The right  ventricular size is moderately enlarged. There is normal pulmonary artery  systolic pressure. The estimated right ventricular systolic pressure is  16.1 mmHg.   3. Left atrial size was moderately dilated.   4. Right atrial size was moderately dilated.   5. The mitral  valve is normal in structure. Moderate mitral valve  regurgitation. No evidence of mitral stenosis.   6. Tricuspid valve regurgitation is moderate to severe.   7. The aortic valve is normal in structure. Aortic valve regurgitation is  mild. Aortic valve sclerosis is present, with no evidence of aortic valve  stenosis.   8. There is borderline dilatation of the ascending aorta, measuring 38  mm.   9. The inferior vena cava is normal in size with greater than 50%  respiratory variability, suggesting right atrial pressure of 3 mmHg.   Patient Profile     67 y.o. female with history of coronary artery calcification noted on CT imaging, atrial flutter status post catheter ablation in 10/2018, PAF, solitary kidney with prior right nephrectomy, HTN, hypothyroidism, and prior tobacco use who is being seen today for the evaluation of A-fib with RVR at the request of Dr. Francine Graven.   Assessment & Plan    1.  A-fib with RVR: -She remains in Afib with RVR with ventricular rates in the low 100s to 120s bpm -Given cardiomyopathy, diltiazem was changed to metoprolol -Now on titrated Toprol XL 50 mg bid  -Add IV digoxin 0.25 every 4 hours x 2 doses followed by oral digoxin 0.25 mg starting 07/17/2022 -Reassess for possible TEE-guided DCCV 07/17/2022 vs DCCV in 4 weeks after she has been adequately anticoagulated without interruption -CHA2DS2-VASc 5 (CHF, HTN, age x 1, vascular disease, sex category)  -Continue 5 mg bid  -Potassium at goal -Replete magnesium as outlined below -TSH normal with mildly elevated free T4  2.  Acute HFrEF: -Possibly tachycardia mediated in the setting of persistent A-fib with RVR, though cannot exclude ischemic heart disease given risk factors as outlined below -Volume improving -Remains on IV Lasix 40 mg daily -Check BMP -Toprol-XL and digoxin as above -Will defer addition of ARB/ACEi/ARNI/MRA/SGLT2i for now in an effort to allow for BP room to titrate rate controlling  medications for Afib with RVR -Escalate GDMT as able -Current md rounding plan is to pursue rate control for now with rhythm control following adequate anticoagulation vs TEE-guided DCCV based on rate and symptoms followed by repeat echo in sinus rhythm and ischemic evaluation via coronary CTA vs R/LHC  3.  Coronary artery calcification with elevated high-sensitivity troponin: -CT imaging from 12/2021 notable for left main and three-vessel coronary artery calcification -Mildly elevated and flat trending high-sensitivity troponin likely representative of supply/demand ischemia in the setting of A-fib with RVR and not consistent with ACS -However, patient does have multiple risk factors for ischemic heart disease including known coronary artery calcification, prior tobacco use, HTN, and female status with age 56 -Most recent LDL 61 from 12/2021 with goal being at least less than  76 with documented coronary artery calcification and aortic atherosclerosis -Start atorvastatin 40 mg -Echo this admission with new cardiomyopathy -Ischemic evaluation as outlined above  4.  History of atrial flutter: -Status post ablation in 2020 -No evidence of recurrence  5.  HTN: -Blood pressure well-controlled -Medical therapy as outlined above  6.  Solitary kidney: -Status post prior right nephrectomy -Monitor renal function, BMP pending  7.  Hypomagnesemia: -Oral repletion ordered by primary service      For questions or updates, please contact [place] Please consult [email or web address] for contact info under Cardiology/STEMI.    Signed, Christell Faith, PA-C [place] Pager: [phone] 07/16/2022, 7:34 AM

## 2022-07-16 NOTE — Plan of Care (Signed)

## 2022-07-17 ENCOUNTER — Telehealth: Payer: Self-pay | Admitting: Nurse Practitioner

## 2022-07-17 DIAGNOSIS — I5021 Acute systolic (congestive) heart failure: Secondary | ICD-10-CM

## 2022-07-17 DIAGNOSIS — I4891 Unspecified atrial fibrillation: Secondary | ICD-10-CM | POA: Diagnosis not present

## 2022-07-17 MED ORDER — METOPROLOL SUCCINATE ER 50 MG PO TB24
50.0000 mg | ORAL_TABLET | Freq: Three times a day (TID) | ORAL | Status: DC
  Administered 2022-07-17 – 2022-07-18 (×3): 50 mg via ORAL
  Filled 2022-07-17 (×3): qty 1

## 2022-07-17 NOTE — Progress Notes (Signed)
Patient walked with mobility and HR went up to 170s during ambulation -- patient stated she felt like she was "about to pass out".

## 2022-07-17 NOTE — Care Management Important Message (Signed)
Important Message  Patient Details  Name: Tricia Alvarez MRN: [id number] Date of Birth: [birth year]   Medicare Important Message Given:  Yes     Dannette Barbara 07/17/2022, 10:56 AM

## 2022-07-17 NOTE — Telephone Encounter (Signed)
Spoke to patient about scheduling 2 week follow up appointment from 07/14/22 Patient declined to schedule at this time due to still being in the hospital  States she will call back to schedule when ready

## 2022-07-17 NOTE — Progress Notes (Signed)
PROGRESS NOTE    Tricia Alvarez  JKD:[id number] DOB: 1956/04/09 DOA: 07/14/2022  PCP: Kirk Ruths, MD Brief Narrative:  This 67 y.o. female with medical history significant for atrial fibrillation treated in the past with dronedarone and then catheter ablation (Eliquis and metoprolol were discontinued on03/21), history of hypothyroidism, alcohol dependence, status post nephrectomy who presents to the ER from the cardiology clinic for evaluation of rapid A-fib. She was noted to have heart rate in the 170s during her cardiology appointment.  Patient has developed palpitation associated with exertional shortness of breath for last 5 days.  She was seen by gynecologist 3-day prior to admission and was advised to follow-up with cardiologist because her physical exam consistent with irregular tachycardia.  Patient also developed orthopnea, not able to lie flat and sleeping in the recliner.  She has lower extremity swelling and has gained 10 pounds in last 1 month.  EKG shows A-fib with RVR.  Patient was given 2 doses of IV Cardizem without any improvement.  Subsequently she was started on Cardizem drip.  Admitted for further evaluation.  Cardiology is consulted.  Assessment & Plan:   Principal Problem:   Atrial fibrillation with RVR (HCC) Active Problems:   Acute diastolic CHF (congestive heart failure) (HCC)   Hypothyroidism   Alcohol dependence ([place])   Essential hypertension   History of atrial flutter  Atrial fibrillation with RVR: Patient with PMH of atrial fibrillation s/p catheter ablation and has been off antiarrhythmic agents for several years as well as Eliquis.  She presented in the ED for the evaluation of exertional shortness of breath, palpitations, dizziness and intermittent chest discomfort. She was found to be tachycardic with heart rate in 170s and EKG showed rapid A-fib. She received 2 doses of IV Cardizem without improvement in her heart rate and started on Cardizem  drip Cardizem discontinued given cardiomyopathy showing low LVEF 30 to 35%. Started on metoprolol 50 mg every 12 hours for heart rate control. 2D echocardiogram shows EF 30 to 35%.  LV function mildly decreased. Cardiology is consulted. Patient started on Eliquis.   She continued to have palpitations while ambulation where heart rate up to 120s. Cardiology started digoxin in addition to metoprolol. Her heart rate continues to remain elevated despite being on metoprolol and digoxin. Cardiology has scheduled TEE with DCCV tomorrow.   ETOH depandence: Patient admits to daily alcohol use but denies having symptoms of alcohol withdrawal when she does not drink. Continue CIWA protocol and administer lorazepam as needed. Continue thiamine, folic acid and multivitamins.   Hypothyroidism: Patient has been on Synthroid 75 mcg and labs show normal TSH with elevated free thyroxine level This could also be contributing to her rapid A-fib Decrease Synthroid to 50 mcg daily Repeat TSH and free thyroxine levels in 6 to 8 weeks.   Acute on chronic systolic CHF. Most likely related to A-fib with RVR. Symptoms include exertional shortness of breath, 3 pillow orthopnea and bilateral lower extremity swelling 2D echocardiogram shows LVEF 30 to 35%. Continue Lasix 40 mg IV daily Increase metoprolol 50 mg thrice daily.  Cardizem discontinued. Cardiology is consulted.  Medication adjusted. No plans for any ischemic evaluation at this time.   Essential hypertension.: Blood pressure is controlled.  Continue metoprolol.  Solitary kidney: S/p right nephrectomy Continue to monitor renal functions.  DVT prophylaxis: Eliquis Code Status: Full code Family Communication: No family at bed side Disposition Plan:   Status is: Inpatient Remains inpatient appropriate because: Admitted for A-fib with RVR  requiring Cardizem gtt. and started on Eliquis. Patient continued to have A-fib,  started on digoxin in  addition to metoprolol. He is scheduled for TEE +DCCV tomorrow.  Consultants:  Cardiology  Procedures:TTE  Antimicrobials: None Subjective: Patient examined at bedside.  Overnight events noted.   Patient reports still having palpitations while walking in the hallway.   Heart rate goes up to 140s despite being on metoprolol and digoxin. Doses adjusted. She denies any chest pain or shortness of breath.   Objective: Vitals:   07/17/22 0834 07/17/22 0841 07/17/22 1012 07/17/22 1158  BP: (!) 116/96 (!) 123/92  126/76  Pulse: (!) 114 96  89  Resp: 20   18  Temp: (!) 97.4 F (36.3 C)   98.5 F (36.9 C)  TempSrc:    Oral  SpO2: 96%   99%  Weight:   76.7 kg   Height:   '5\' 7"'$  (1.702 m)    No intake or output data in the 24 hours ending 07/17/22 1247  Filed Weights   07/17/22 1012  Weight: 76.7 kg    Examination:  General exam: Appears comfortable, not in any acute distress.  Anxious. Respiratory system: CTA bilaterally, respiratory effort normal, RR 15. Cardiovascular system: S1-S2 heard, Irregular rhythm, no murmur. Gastrointestinal system: Abdomen is soft, non tender, non distended, BS+ Central nervous system: Alert and oriented x 3. No focal neurological deficits. Extremities:Edema +, No cyanosis, no clubbing Skin: No rashes, lesions or ulcers Psychiatry: Judgement and insight appear normal. Mood & affect appropriate.     Data Reviewed: I have personally reviewed following labs and imaging studies  CBC: Recent Labs  Lab 07/14/22 1426 07/15/22 0537  WBC 7.4 6.2  HGB 13.1 11.6*  HCT 40.6 36.5  MCV 94.4 95.5  PLT 246 478   Basic Metabolic Panel: Recent Labs  Lab 07/14/22 1426 07/14/22 1700 07/15/22 0537 07/16/22 1118  NA 135  --  137 139  K 4.0  --  3.9 3.5  CL 101  --  104 102  CO2 22  --  22 26  GLUCOSE 108*  --  95 95  BUN 14  --  12 15  CREATININE 0.87  --  0.89 0.96  CALCIUM 10.2  --  9.2 9.1  MG  --  1.4*  --   --    GFR: Estimated  Creatinine Clearance: 62.3 mL/min (by C-G formula based on SCr of 0.96 mg/dL). Liver Function Tests: No results for input(s): "AST", "ALT", "ALKPHOS", "BILITOT", "PROT", "ALBUMIN" in the last 168 hours. No results for input(s): "LIPASE", "AMYLASE" in the last 168 hours. No results for input(s): "AMMONIA" in the last 168 hours. Coagulation Profile: Recent Labs  Lab 07/14/22 1700  INR 1.4*   Cardiac Enzymes: No results for input(s): "CKTOTAL", "CKMB", "CKMBINDEX", "TROPONINI" in the last 168 hours. BNP (last 3 results) No results for input(s): "PROBNP" in the last 8760 hours. HbA1C: No results for input(s): "HGBA1C" in the last 72 hours. CBG: No results for input(s): "GLUCAP" in the last 168 hours. Lipid Profile: No results for input(s): "CHOL", "HDL", "LDLCALC", "TRIG", "CHOLHDL", "LDLDIRECT" in the last 72 hours. Thyroid Function Tests: Recent Labs    07/14/22 1426  TSH 3.347  FREET4 1.42*   Anemia Panel: No results for input(s): "VITAMINB12", "FOLATE", "FERRITIN", "TIBC", "IRON", "RETICCTPCT" in the last 72 hours. Sepsis Labs: Recent Labs  Lab 07/15/22 0537  PROCALCITON <0.10    No results found for this or any previous visit (from the past 240 hour(s)).  Radiology Studies: No results found.  Scheduled Meds:  apixaban  5 mg Oral BID   vitamin C  2,000 mg Oral Daily   atorvastatin  40 mg Oral Daily   calcium carbonate  1 tablet Oral Daily   cholecalciferol  2,000 Units Oral Daily   digoxin  0.25 mg Oral Daily   folic acid  1 mg Oral Daily   furosemide  40 mg Intravenous Daily   levothyroxine  50 mcg Oral Q0600   LORazepam  0-4 mg Oral Q12H   magnesium gluconate  500 mg Oral Daily   metoprolol succinate  50 mg Oral Q8H   multivitamin with minerals  1 tablet Oral Daily   thiamine  100 mg Oral Daily   Or   thiamine  100 mg Intravenous Daily   Continuous Infusions:   LOS: 3 days    Time spent: 35 mins    Damaria Vachon, MD [facility]   If  7PM-7AM, please contact night-coverage

## 2022-07-17 NOTE — Progress Notes (Signed)
Progress Note  Patient Name: Tricia Alvarez Date of Encounter: 07/17/2022  Primary Cardiologist: Caryl Comes  Subjective   Echo this admission demonstrated an EF of 30 to 35% with global hypokinesis, mildly reduced RV systolic function with moderately enlarged ventricular cavity size, normal PASP, moderate biatrial enlargement, moderate mitral regurgitation, moderate to severe tricuspid regurgitation, aortic valve sclerosis without evidence of stenosis, borderline dilatation of the ascending aorta measuring 38 mm, and an estimated right atrial pressure of 3 mmHg.  Digoxin was added yesterday.  In spite of this, she continues to be tachycardic with a heart rate between 110 to 130 bpm.  She complains of palpitations and shortness of breath.  Inpatient Medications    Scheduled Meds:  apixaban  5 mg Oral BID   vitamin C  2,000 mg Oral Daily   atorvastatin  40 mg Oral Daily   calcium carbonate  1 tablet Oral Daily   cholecalciferol  2,000 Units Oral Daily   digoxin  0.25 mg Oral Daily   folic acid  1 mg Oral Daily   furosemide  40 mg Intravenous Daily   levothyroxine  50 mcg Oral Q0600   LORazepam  0-4 mg Oral Q12H   magnesium gluconate  500 mg Oral Daily   metoprolol succinate  50 mg Oral Q8H   multivitamin with minerals  1 tablet Oral Daily   thiamine  100 mg Oral Daily   Or   thiamine  100 mg Intravenous Daily   Continuous Infusions:  PRN Meds: acetaminophen **OR** acetaminophen, ondansetron **OR** ondansetron (ZOFRAN) IV   Vital Signs    Vitals:   07/17/22 0356 07/17/22 0833 07/17/22 0834 07/17/22 0841  BP: (!) 120/94 (!) 132/102 (!) 116/96 (!) 123/92  Pulse: 90 (!) 113 (!) 114 96  Resp:  20 20   Temp: 98.2 F (36.8 C) (!) 97.4 F (36.3 C) (!) 97.4 F (36.3 C)   TempSrc: Oral     SpO2: 95% 94% 96%    No intake or output data in the 24 hours ending 07/17/22 0924 There were no vitals filed for this visit.  Telemetry    Afib with RVR with ventricular rates in the  low 100s to 130s bpm - Personally Reviewed  ECG    No new tracings - Personally Reviewed  Physical Exam   GEN: No acute distress.   Neck: No JVD. Cardiac: Tachycardic, IRIR, no murmurs, rubs, or gallops.  Respiratory: Clear to auscultation bilaterally.  GI: Soft, nontender, non-distended.   MS: No edema; No deformity. Neuro:  Alert and oriented x 3; Nonfocal.  Psych: Normal affect.  Labs    Chemistry Recent Labs  Lab 07/14/22 1426 07/15/22 0537 07/16/22 1118  NA 135 137 139  K 4.0 3.9 3.5  CL 101 104 102  CO2 '22 22 26  '$ GLUCOSE 108* 95 95  BUN '14 12 15  '$ CREATININE 0.87 0.89 0.96  CALCIUM 10.2 9.2 9.1  GFRNONAA >60 >60 >60  ANIONGAP '12 11 11      '$ Hematology Recent Labs  Lab 07/14/22 1426 07/15/22 0537  WBC 7.4 6.2  RBC 4.30 3.82*  HGB 13.1 11.6*  HCT 40.6 36.5  MCV 94.4 95.5  MCH 30.5 30.4  MCHC 32.3 31.8  RDW 14.6 14.7  PLT 246 206     Cardiac EnzymesNo results for input(s): "TROPONINI" in the last 168 hours. No results for input(s): "TROPIPOC" in the last 168 hours.   BNP Recent Labs  Lab 07/14/22 1918  BNP 708.8*  DDimer No results for input(s): "DDIMER" in the last 168 hours.   Radiology    DG Chest 2 View  Result Date: 07/14/2022 IMPRESSION: Patchy and linear opacities in both lung bases, which may represent atelectasis or pneumonia. Electronically Signed   By: Ronney Asters M.D.   On: 07/14/2022 15:05    Cardiac Studies   2D echo 07/15/2022: 1. Left ventricular ejection fraction, by estimation, is 30 to 35%. The  left ventricle has moderately decreased function. The left ventricle  demonstrates global hypokinesis. Left ventricular diastolic parameters are  indeterminate.   2. Right ventricular systolic function is mildly reduced. The right  ventricular size is moderately enlarged. There is normal pulmonary artery  systolic pressure. The estimated right ventricular systolic pressure is  60.7 mmHg.   3. Left atrial size was  moderately dilated.   4. Right atrial size was moderately dilated.   5. The mitral valve is normal in structure. Moderate mitral valve  regurgitation. No evidence of mitral stenosis.   6. Tricuspid valve regurgitation is moderate to severe.   7. The aortic valve is normal in structure. Aortic valve regurgitation is  mild. Aortic valve sclerosis is present, with no evidence of aortic valve  stenosis.   8. There is borderline dilatation of the ascending aorta, measuring 38  mm.   9. The inferior vena cava is normal in size with greater than 50%  respiratory variability, suggesting right atrial pressure of 3 mmHg.   Patient Profile     67 y.o. female with history of coronary artery calcification noted on CT imaging, atrial flutter status post catheter ablation in 10/2018, PAF, solitary kidney with prior right nephrectomy, HTN, hypothyroidism, and prior tobacco use who is being seen today for the evaluation of A-fib with RVR at the request of Dr. Francine Graven.   Assessment & Plan    1.  A-fib with RVR: -She remains in Afib with RVR with ventricular rates in the low 100s to 120s bpm  -Ventricular rate is still not controlled.  Increase Toprol to 50 mg every 8 hours. -Continue digoxin.  Will check digoxin level tomorrow. -CHA2DS2-VASc 5 (CHF, HTN, age x 1, vascular disease, sex category)  -Continue Eliquis 5 mg bid  -TSH normal with mildly elevated free T4 -Considering degree of tachycardia on presentation, I suspect that we will not be able to control her ventricular rate with medications.  The best option is to proceed with TEE cardiac cardioversion.  I discussed the procedure in details as well as risk and benefits and we will try to schedule tomorrow.  2.  Acute HFrEF: -Possibly tachycardia mediated in the setting of persistent A-fib with RVR, though cannot exclude ischemic heart disease given risk factors as outlined below -Volume improving -Remains on IV Lasix 40 mg daily -Toprol-XL and  digoxin as above -Will defer addition of ARB/ACEi/ARNI/MRA/SGLT2i for now in an effort to allow for BP room to titrate rate controlling medications for Afib with RVR -Escalate GDMT as able -Recommend repeat echocardiogram few weeks after getting her back in sinus rhythm to reevaluate the EF.  If EF remains low, she will require further ischemic cardiac evaluation.  3.  Coronary artery calcification with elevated high-sensitivity troponin: -CT imaging from 12/2021 notable for left main and three-vessel coronary artery calcification -Mildly elevated and flat trending high-sensitivity troponin likely representative of supply/demand ischemia in the setting of A-fib with RVR and not consistent with ACS -Further ischemic cardiac evaluation as an outpatient once we address the rhythm issue.  4.  History of atrial flutter: -Status post ablation in 2020  5.  HTN: -Blood pressure well-controlled -Medical therapy as outlined above  6.  Solitary kidney: -Status post prior right nephrectomy -Monitor renal function.   Total encounter time more than 50 minutes. Greater than 50% was spent in counseling and coordination of care with the patient.        For questions or updates, please contact [place] Please consult [email or web address] for contact info under Cardiology/STEMI.    Signed, Kathlyn Sacramento, MD [facility] 07/17/2022, 9:24 AM

## 2022-07-17 NOTE — Consult Note (Addendum)
   Heart Failure Nurse Navigator Note  HFrEF 30 to 35%.  Left ventricular diastolic parameters were indeterminate.  Right ventricular systolic function mildly reduced.  Ventricular size is moderately enlarged.  Moderate biatrial enlargement.  Moderate mitral regurgitation.  Moderate to severe tricuspid regurgitation.  Mild aortic insufficiency.  She presented from her cardiology's office after she presented there with complaints of palpitations, shortness of breath, near syncopal episodes and weight  gain of approximately 10 pounds in the last month.  EKG revealed rapid A-fib.  Chest x-ray showed linear opacities in both lung bases.  BNP 708.  Comorbidities:  Atrial flutter with ablation Paroxysmal atrial fibrillation Solitary kidney status post right nephrectomy Hypertension Hypothyroidism History of smoking History of alcohol intake  Medications:  Apixaban 5 mg 2 times a day Atorvastatin 40 mg daily Digoxin 0.25 mg daily Furosemide 40 mg IV daily Levothyroxine 50 mcg daily Metoprolol succinate 50 mg every 8 hours  Labs:  Sodium 139, potassium 3.5, chloride 102, CO2 26, BUN 15, creatinine 0.96, estimated GFR greater than 60. Weight 76.7 kg Intake not documented Output not documented Blood pressure 126/76 Pulse 89   Initial meeting with patient on this admission.  She was currently sitting at the bedside in no acute distress.  She states that she was admitted with palpitations, a 10 pound weight gain and complaints of lower extremity swelling.  Discussed heart failure and what it means.  Also went over the function of her heart as compared to normal.  Went over the importance of fluid restriction of 64 ounces daily.  Also discussed low sodium diet and removing salt from the table.  She states she is currently using [place] salt, explained that salt is salt and how it reacts to fluid in the body.  Also went over daily weights and what to report.  Along with changes in  symptoms.  She was given the living with heart failure teaching booklet, zone magnet, info on low sodium and heart failure along with weight chart.   She has follow-up in the outpatient heart failure clinic on January 30 at 8:30 in the morning.  Pricilla Riffle RN [facility]

## 2022-07-17 NOTE — TOC Progression Note (Signed)
Transition of Care [facility]) - Progression Note    Patient Details  Name: Tricia Alvarez MRN: [id number] Date of Birth: 03/14/1956  [facility] of [facility] CM/SW Contact  Laurena Slimmer, RN Phone Number: 07/17/2022, 3:11 PM  Clinical Narrative:    Substance abuse resources added to AVS.         Expected Discharge Plan and Services                                               Social Determinants of Health (SDOH) Interventions SDOH Screenings   Food Insecurity: Unknown (07/15/2022)  Tobacco Use: Medium Risk (07/14/2022)    Readmission Risk Interventions     No data to display

## 2022-07-18 ENCOUNTER — Inpatient Hospital Stay: Payer: Medicare Other | Admitting: Certified Registered Nurse Anesthetist

## 2022-07-18 ENCOUNTER — Encounter
Admission: EM | Disposition: A | Discharge status: Home / Self Care | Payer: Self-pay | Source: Home / Self Care | Attending: Family Medicine

## 2022-07-18 ENCOUNTER — Inpatient Hospital Stay (HOSPITAL_COMMUNITY)
Admit: 2022-07-18 | Discharge: 2022-07-18 | Disposition: A | Discharge status: Home / Self Care | Payer: Medicare Other | Attending: Nurse Practitioner | Admitting: Nurse Practitioner

## 2022-07-18 DIAGNOSIS — I340 Nonrheumatic mitral (valve) insufficiency: Secondary | ICD-10-CM

## 2022-07-18 DIAGNOSIS — I420 Dilated cardiomyopathy: Secondary | ICD-10-CM

## 2022-07-18 DIAGNOSIS — I351 Nonrheumatic aortic (valve) insufficiency: Secondary | ICD-10-CM

## 2022-07-18 DIAGNOSIS — I5021 Acute systolic (congestive) heart failure: Secondary | ICD-10-CM | POA: Diagnosis not present

## 2022-07-18 DIAGNOSIS — I4891 Unspecified atrial fibrillation: Secondary | ICD-10-CM

## 2022-07-18 HISTORY — PX: TEE WITHOUT CARDIOVERSION: SHX5443

## 2022-07-18 HISTORY — PX: CARDIOVERSION: SHX1299

## 2022-07-18 LAB — DIGOXIN LEVEL: Digoxin Level: 0.9 ng/mL (ref 0.8–2.0)

## 2022-07-18 SURGERY — ECHOCARDIOGRAM, TRANSESOPHAGEAL
Anesthesia: General

## 2022-07-18 SURGERY — CARDIOVERSION
Anesthesia: General

## 2022-07-18 MED ORDER — PHENYLEPHRINE 80 MCG/ML (10ML) SYRINGE FOR IV PUSH (FOR BLOOD PRESSURE SUPPORT)
PREFILLED_SYRINGE | INTRAVENOUS | Status: DC | PRN
  Administered 2022-07-18: 160 ug via INTRAVENOUS

## 2022-07-18 MED ORDER — FUROSEMIDE 40 MG PO TABS
40.0000 mg | ORAL_TABLET | Freq: Every day | ORAL | Status: DC
  Administered 2022-07-18 – 2022-07-19 (×2): 40 mg via ORAL
  Filled 2022-07-18 (×2): qty 1

## 2022-07-18 MED ORDER — PHENYLEPHRINE 80 MCG/ML (10ML) SYRINGE FOR IV PUSH (FOR BLOOD PRESSURE SUPPORT)
PREFILLED_SYRINGE | INTRAVENOUS | Status: AC
  Filled 2022-07-18: qty 20

## 2022-07-18 MED ORDER — MIDAZOLAM HCL 2 MG/2ML IJ SOLN
INTRAMUSCULAR | Status: AC
  Filled 2022-07-18: qty 2

## 2022-07-18 MED ORDER — AMIODARONE HCL 200 MG PO TABS
200.0000 mg | ORAL_TABLET | Freq: Two times a day (BID) | ORAL | Status: DC
  Administered 2022-07-18 – 2022-07-19 (×2): 200 mg via ORAL
  Filled 2022-07-18 (×2): qty 1

## 2022-07-18 MED ORDER — PROPOFOL 10 MG/ML IV BOLUS
INTRAVENOUS | Status: DC | PRN
  Administered 2022-07-18: 20 mg via INTRAVENOUS
  Administered 2022-07-18: 10 mg via INTRAVENOUS
  Administered 2022-07-18: 30 mg via INTRAVENOUS

## 2022-07-18 MED ORDER — LOSARTAN POTASSIUM 25 MG PO TABS
25.0000 mg | ORAL_TABLET | Freq: Every day | ORAL | Status: DC
  Administered 2022-07-18 – 2022-07-19 (×2): 25 mg via ORAL
  Filled 2022-07-18 (×2): qty 1

## 2022-07-18 MED ORDER — METOPROLOL SUCCINATE ER 100 MG PO TB24
100.0000 mg | ORAL_TABLET | Freq: Every day | ORAL | Status: DC
  Administered 2022-07-19: 100 mg via ORAL
  Filled 2022-07-18: qty 1

## 2022-07-18 MED ORDER — BUTAMBEN-TETRACAINE-BENZOCAINE 2-2-14 % EX AERO
INHALATION_SPRAY | CUTANEOUS | Status: AC
  Filled 2022-07-18: qty 5

## 2022-07-18 MED ORDER — EPHEDRINE 5 MG/ML INJ
INTRAVENOUS | Status: AC
  Filled 2022-07-18: qty 15

## 2022-07-18 MED ORDER — LIDOCAINE VISCOUS HCL 2 % MT SOLN
OROMUCOSAL | Status: AC
  Filled 2022-07-18: qty 15

## 2022-07-18 MED ORDER — MIDAZOLAM HCL 2 MG/2ML IJ SOLN
INTRAMUSCULAR | Status: DC | PRN
  Administered 2022-07-18: 2 mg via INTRAVENOUS

## 2022-07-18 MED ORDER — SODIUM CHLORIDE 0.9 % IV SOLN: INTRAVENOUS | Status: DC

## 2022-07-18 NOTE — CV Procedure (Signed)
Cardioversion procedure note For atrial fibrillation, persistent  Procedure Details:  Consent: Risks of procedure as well as the alternatives and risks of each were explained to the (patient/caregiver).  Consent for procedure obtained.  Time Out: Verified patient identification, verified procedure, site/side was marked, verified correct patient position, special equipment/implants available, medications/allergies/relevent history reviewed, required imaging and test results available.  Performed  Patient placed on cardiac monitor, pulse oximetry, supplemental oxygen as necessary.   Sedation given: propofol IV, Dr. Amie Critchley Pacer pads placed anterior and posterior chest.   Cardioverted 1 time(s).   Cardioverted at 150 J. Synchronized biphasic Converted to NSR   Evaluation: Findings: Post procedure EKG shows: NSR Complications: None Patient did tolerate procedure well.  Time Spent Directly with the Patient:  18 minutes   Esmond Plants, M.D., Ph.D.

## 2022-07-18 NOTE — Anesthesia Postprocedure Evaluation (Signed)
Anesthesia Post Note  Patient: Tricia Alvarez  Procedure(s) Performed: CARDIOVERSION TRANSESOPHAGEAL ECHOCARDIOGRAM (TEE)  Patient location during evaluation: Specials Recovery Anesthesia Type: General Level of consciousness: awake and alert Pain management: pain level controlled Vital Signs Assessment: post-procedure vital signs reviewed and stable Respiratory status: spontaneous breathing, nonlabored ventilation, respiratory function stable and patient connected to nasal cannula oxygen Cardiovascular status: blood pressure returned to baseline and stable Postop Assessment: no apparent nausea or vomiting Anesthetic complications: no   No notable events documented.   Last Vitals:  Vitals:   07/18/22 1315 07/18/22 1320  BP: 95/71 92/67  Pulse: 62 61  Resp: (!) 21 (!) 22  Temp:    SpO2: 97% 98%    Last Pain:  Vitals:   07/18/22 1320  TempSrc:   PainSc: Asleep                 Precious Haws Andra Matsuo

## 2022-07-18 NOTE — H&P (Signed)
H&P Addendum, pre-TEE and cardioversion  Patient was seen and evaluated prior to -cardioversion procedure Symptoms, prior testing details again confirmed with the patient Patient examined, no significant change from prior exam Lab work reviewed in detail personally by myself Patient understands risk and benefit of the procedure,  The risks (stroke, cardiac arrhythmias rarely resulting in the need for a temporary or permanent pacemaker, skin irritation or burns and complications associated with conscious sedation including aspiration, arrhythmia, respiratory failure and death), benefits (restoration of normal sinus rhythm) and alternatives of a direct current cardioversion were explained in detail Patient willing to proceed.  Signed, Esmond Plants, MD, Ph.D [facility]

## 2022-07-18 NOTE — Progress Notes (Signed)
Transesophageal Echocardiogram :  Indication: Persistent atrial fibrillation Requesting/ordering  physician: Dr. Dwyane Dee  Procedure: Benzocaine spray x2 and 2 mls x 2 of viscous lidocaine were given orally to provide local anesthesia to the oropharynx. The patient was positioned supine on the left side, bite block provided. The patient was moderately sedated with the doses of versed and fentanyl as detailed below.  Using digital technique an omniplane probe was advanced into the distal esophagus without incident.   Moderate sedation: 1. Sedation used: Per anesthesia team  See report in EPIC  for complete details: In brief,  No thrombus in the left atrium or left atrial appendage transgastric imaging revealed normal LV function with no RWMAs and no mural apical thrombus.  .  Estimated ejection fraction was 30%.  Right sided cardiac chambers were normal with no evidence of pulmonary hypertension.  Imaging of the septum showed no ASD or VSD Bubble study was negative for shunt 2D and color flow confirmed no PFO  Trace AI, mild MR mild TR  The LA was well visualized in orthogonal views.  There was no spontaneous contrast and no thrombus in the LA and LA appendage   The descending thoracic aorta had no  mural aortic debris with no evidence of aneurysmal dilation or dissection  Cardioversion to follow   Tricia Alvarez 07/18/2022 1:15 PM

## 2022-07-18 NOTE — Anesthesia Preprocedure Evaluation (Signed)
Anesthesia Evaluation  Patient identified by MRN, date of birth, ID band Patient awake    Reviewed: Allergy & Precautions, NPO status , Patient's Chart, lab work & pertinent test results  History of Anesthesia Complications Negative for: history of anesthetic complications  Airway Mallampati: III  TM Distance: <3 FB Neck ROM: full    Dental  (+) Chipped   Pulmonary neg shortness of breath, former smoker   Pulmonary exam normal        Cardiovascular Exercise Tolerance: Good hypertension, (-) angina +CHF  (-) Past MI + dysrhythmias Atrial Fibrillation  Rhythm:irregular Rate:Tachycardia     Neuro/Psych negative neurological ROS  negative psych ROS   GI/Hepatic negative GI ROS, Neg liver ROS,,,  Endo/Other  Hypothyroidism    Renal/GU Renal disease  negative genitourinary   Musculoskeletal   Abdominal   Peds  Hematology negative hematology ROS (+)   Anesthesia Other Findings Past Medical History: No date: Arthritis No date: Atrial flutter ([place])     Comment:  a. 07/2018 - occurred on cruise ship. Flutter waves seen               after adenosine->DCCV on ship; b. 10/2018 s/p RFCA. No date: Cancer [facility])     Comment:  skin and vulvular No date: History of cardiac radiofrequency ablation No date: Hypothyroidism No date: Osteopenia No date: Osteoporosis No date: PAF (paroxysmal atrial fibrillation) (HCC)     Comment:  a. prev on dronedarone; b. 08/2019 Holter: No               afib-->eliquis d/c'd. No date: Renal disorder     Comment:  right nephrectomy  Past Surgical History: No date: cardiac radiofrequency ablation No date: CARPAL TUNNEL RELEASE; Bilateral No date: CESAREAN SECTION 05/25/2021: COLONOSCOPY WITH PROPOFOL; N/A     Comment:  Procedure: COLONOSCOPY WITH PROPOFOL;  Surgeon: [place],               Benay Pike, MD;  Location: [facility] ENDOSCOPY;  Service:               Gastroenterology;  Laterality:  N/A;  BMI    Body Mass Index: 26.48 kg/m      Reproductive/Obstetrics negative OB ROS                             Anesthesia Physical Anesthesia Plan  ASA: 4  Anesthesia Plan: General   Post-op Pain Management:    Induction: Intravenous  PONV Risk Score and Plan: Propofol infusion and TIVA  Airway Management Planned: Natural Airway and Nasal Cannula  Additional Equipment:   Intra-op Plan:   Post-operative Plan:   Informed Consent: I have reviewed the patients History and Physical, chart, labs and discussed the procedure including the risks, benefits and alternatives for the proposed anesthesia with the patient or authorized representative who has indicated his/her understanding and acceptance.     Dental Advisory Given  Plan Discussed with: Anesthesiologist, CRNA and Surgeon  Anesthesia Plan Comments: (Patient consented for risks of anesthesia including but not limited to:  - adverse reactions to medications - risk of airway placement if required - damage to eyes, teeth, lips or other oral mucosa - nerve damage due to positioning  - sore throat or hoarseness - Damage to heart, brain, nerves, lungs, other parts of body or loss of life  Patient voiced understanding.)       Anesthesia Quick Evaluation

## 2022-07-18 NOTE — Progress Notes (Signed)
*  PRELIMINARY RESULTS* Echocardiogram Echocardiogram Transesophageal has been performed.  Tricia Alvarez 07/18/2022, 1:25 PM

## 2022-07-18 NOTE — Progress Notes (Signed)
PROGRESS NOTE    Tricia Alvarez  IDP:[id number] DOB: 1955-11-06 DOA: 07/14/2022  PCP: Kirk Ruths, MD Brief Narrative:  This 67 y.o. female with medical history significant for atrial fibrillation treated in the past with dronedarone and then catheter ablation (Eliquis and metoprolol were discontinued on03/21), history of hypothyroidism, alcohol dependence, status post nephrectomy who presents to the ER from the cardiology clinic for evaluation of rapid A-fib. She was noted to have heart rate in the 170s during her cardiology appointment.  Patient has developed palpitation associated with exertional shortness of breath for last 5 days.  She was seen by gynecologist 3-day prior to admission and was advised to follow-up with cardiologist because her physical exam consistent with irregular tachycardia.  Patient also developed orthopnea, not able to lie flat and sleeping in the recliner.  She has lower extremity swelling and has gained 10 pounds in last 1 month.  EKG shows A-fib with RVR.  Patient was given 2 doses of IV Cardizem without any improvement.  Subsequently she was started on Cardizem drip.  Admitted for further evaluation.  Cardiology is consulted.  Assessment & Plan:   Principal Problem:   Atrial fibrillation with RVR (HCC) Active Problems:   Acute diastolic CHF (congestive heart failure) (HCC)   Hypothyroidism   Alcohol dependence ([place])   Essential hypertension   History of atrial flutter   Dilated cardiomyopathy (HCC)  Atrial fibrillation with RVR: Patient with PMH of atrial fibrillation s/p catheter ablation and has been off antiarrhythmic agents for several years as well as Eliquis.  She presented in the ED for the evaluation of exertional shortness of breath, palpitations, dizziness and intermittent chest discomfort. She was found to be tachycardic with heart rate in 170s and EKG showed rapid A-fib. She received 2 doses of IV Cardizem without improvement in her heart  rate and started on Cardizem drip Cardizem discontinued given cardiomyopathy showing low LVEF 30 to 35%. Started on metoprolol 50 mg every 12 hours for heart rate control. 2D echocardiogram shows EF 30 to 35%.  LV function mildly decreased. Cardiology is consulted. Patient started on Eliquis.   She continued to have palpitations while ambulation where heart rate up to 120s. Cardiology started digoxin in addition to metoprolol. Her heart rate continues to remain elevated despite being on metoprolol and digoxin. Patient underwent successful TEE with cardioversion to normal sinus rhythm  07/18/22.   ETOH depandence: Patient admits to daily alcohol use but denies having symptoms of alcohol withdrawal when she does not drink. Continue CIWA protocol and administer lorazepam as needed. Continue thiamine, folic acid and multivitamins.   Hypothyroidism: Patient has been on Synthroid 75 mcg and labs show normal TSH with elevated free thyroxine level This could also be contributing to her rapid A-fib Decrease Synthroid to 50 mcg daily Repeat TSH and free thyroxine levels in 6 to 8 weeks.   Acute on chronic systolic CHF. Most likely related to A-fib with RVR. Symptoms include exertional shortness of breath, 3 pillow orthopnea and bilateral lower extremity swelling 2D echocardiogram shows LVEF 30 to 35%. Continue Lasix 40 mg IV daily Continue metoprolol 50 mg thrice daily.  Cardizem discontinued. Cardiology is consulted.  Medication adjusted. No plans for any ischemic evaluation at this time.   Essential hypertension.: Blood pressure is controlled.  Continue metoprolol.  Solitary kidney: S/p right nephrectomy Continue to monitor renal functions.  DVT prophylaxis: Eliquis Code Status: Full code Family Communication: No family at bed side Disposition Plan:   Status is:  Inpatient Remains inpatient appropriate because: Admitted for A-fib with RVR requiring Cardizem gtt. and started on  Eliquis. Patient continued to have A-fib,  started on digoxin in addition to metoprolol. She underwent successful TEE plus DCCV to normal sinus rhythm.  Consultants:  Cardiology  Procedures:TTE  Antimicrobials: None Subjective: Patient examined at bedside.  Overnight events noted.   Patient reports still having the palpitations when she gets out of bed. She denies any chest pain or shortness of breath.  She is scheduled to have TEE with cardioversion today.  Objective: Vitals:   07/18/22 1320 07/18/22 1330 07/18/22 1347 07/18/22 1413  BP: 92/67 96/72 106/81 110/78  Pulse: 61 62 62 64  Resp: (!) 22 (!) '21 18 16  '$ Temp:      TempSrc:      SpO2: 98% 96% 95% 100%  Weight:      Height:        Intake/Output Summary (Last 24 hours) at 07/18/2022 1435 Last data filed at 07/18/2022 1301 Gross per 24 hour  Intake 200 ml  Output --  Net 200 ml    Filed Weights   07/17/22 1012 07/18/22 1224  Weight: 76.7 kg 76.7 kg    Examination:  General exam: Appears anxious but comfortable, not in any acute distress. Respiratory system: CTA bilaterally, respiratory effort normal, RR 15. Cardiovascular system: S1-S2 heard, irregular rhythm, no murmur. Gastrointestinal system: Abdomen is soft, non tender, non distended, BS+ Central nervous system: Alert and oriented x 3. No focal neurological deficits. Extremities:Edema +, No cyanosis, no clubbing Skin: No rashes, lesions or ulcers Psychiatry: Judgement and insight appear normal. Mood & affect appropriate.     Data Reviewed: I have personally reviewed following labs and imaging studies  CBC: Recent Labs  Lab 07/14/22 1426 07/15/22 0537  WBC 7.4 6.2  HGB 13.1 11.6*  HCT 40.6 36.5  MCV 94.4 95.5  PLT 246 382   Basic Metabolic Panel: Recent Labs  Lab 07/14/22 1426 07/14/22 1700 07/15/22 0537 07/16/22 1118  NA 135  --  137 139  K 4.0  --  3.9 3.5  CL 101  --  104 102  CO2 22  --  22 26  GLUCOSE 108*  --  95 95  BUN 14   --  12 15  CREATININE 0.87  --  0.89 0.96  CALCIUM 10.2  --  9.2 9.1  MG  --  1.4*  --   --    GFR: Estimated Creatinine Clearance: 62.3 mL/min (by C-G formula based on SCr of 0.96 mg/dL). Liver Function Tests: No results for input(s): "AST", "ALT", "ALKPHOS", "BILITOT", "PROT", "ALBUMIN" in the last 168 hours. No results for input(s): "LIPASE", "AMYLASE" in the last 168 hours. No results for input(s): "AMMONIA" in the last 168 hours. Coagulation Profile: Recent Labs  Lab 07/14/22 1700  INR 1.4*   Cardiac Enzymes: No results for input(s): "CKTOTAL", "CKMB", "CKMBINDEX", "TROPONINI" in the last 168 hours. BNP (last 3 results) No results for input(s): "PROBNP" in the last 8760 hours. HbA1C: No results for input(s): "HGBA1C" in the last 72 hours. CBG: No results for input(s): "GLUCAP" in the last 168 hours. Lipid Profile: No results for input(s): "CHOL", "HDL", "LDLCALC", "TRIG", "CHOLHDL", "LDLDIRECT" in the last 72 hours. Thyroid Function Tests: No results for input(s): "TSH", "T4TOTAL", "FREET4", "T3FREE", "THYROIDAB" in the last 72 hours.  Anemia Panel: No results for input(s): "VITAMINB12", "FOLATE", "FERRITIN", "TIBC", "IRON", "RETICCTPCT" in the last 72 hours. Sepsis Labs: Recent Labs  Lab 07/15/22 [phone]  PROCALCITON <0.10    No results found for this or any previous visit (from the past 240 hour(s)).    Radiology Studies: No results found.  Scheduled Meds:  apixaban  5 mg Oral BID   vitamin C  2,000 mg Oral Daily   atorvastatin  40 mg Oral Daily   butamben-tetracaine-benzocaine       calcium carbonate  1 tablet Oral Daily   cholecalciferol  2,000 Units Oral Daily   digoxin  0.25 mg Oral Daily   folic acid  1 mg Oral Daily   furosemide  40 mg Intravenous Daily   levothyroxine  50 mcg Oral Q0600   lidocaine       LORazepam  0-4 mg Oral Q12H   magnesium gluconate  500 mg Oral Daily   metoprolol succinate  50 mg Oral Q8H   multivitamin with minerals  1  tablet Oral Daily   thiamine  100 mg Oral Daily   Or   thiamine  100 mg Intravenous Daily   Continuous Infusions:   LOS: 4 days    Time spent: 35 mins    Kennon Encinas, MD [facility]   If 7PM-7AM, please contact night-coverage

## 2022-07-18 NOTE — Transfer of Care (Signed)
Immediate Anesthesia Transfer of Care Note  Patient: Tricia Alvarez  Procedure(s) Performed: CARDIOVERSION TRANSESOPHAGEAL ECHOCARDIOGRAM (TEE)  Patient Location: Nursing Unit  Anesthesia Type:General  Level of Consciousness: drowsy  Airway & Oxygen Therapy: Patient Spontanous Breathing  Post-op Assessment: Report given to RN and Post -op Vital signs reviewed and stable  Post vital signs: Reviewed and stable  Last Vitals:  Vitals Value Taken Time  BP 102/71 07/18/22 1303  Temp    Pulse 55 07/18/22 1304  Resp 24 07/18/22 1304  SpO2 98 % 07/18/22 1304    Last Pain:  Vitals:   07/18/22 1224  TempSrc: Oral  PainSc: 0-No pain         Complications: No notable events documented.

## 2022-07-18 NOTE — Anesthesia Procedure Notes (Signed)
Date/Time: 07/18/2022 12:40 PM  Performed by: Lily Peer, Jerime Arif, CRNAPre-anesthesia Checklist: Patient identified, Emergency Drugs available, Suction available, Patient being monitored and Timeout performed Patient Re-evaluated:Patient Re-evaluated prior to induction Oxygen Delivery Method: Nasal cannula Induction Type: IV induction

## 2022-07-18 NOTE — Progress Notes (Signed)
Progress Note  Patient Name: Tricia Alvarez Date of Encounter: 07/18/2022  Primary Cardiologist: Caryl Comes  Subjective   She underwent successful TEE guided cardioversion today with restoration of normal sinus rhythm.  She feels better.  She used to be on Multaq in the past.  EF is 30 to 35%.  Inpatient Medications    Scheduled Meds:  amiodarone  200 mg Oral BID   apixaban  5 mg Oral BID   vitamin C  2,000 mg Oral Daily   atorvastatin  40 mg Oral Daily   butamben-tetracaine-benzocaine       calcium carbonate  1 tablet Oral Daily   cholecalciferol  2,000 Units Oral Daily   folic acid  1 mg Oral Daily   furosemide  40 mg Oral Daily   levothyroxine  50 mcg Oral Q0600   lidocaine       LORazepam  0-4 mg Oral Q12H   magnesium gluconate  500 mg Oral Daily   [START ON 07/19/2022] metoprolol succinate  100 mg Oral Daily   multivitamin with minerals  1 tablet Oral Daily   thiamine  100 mg Oral Daily   Or   thiamine  100 mg Intravenous Daily   Continuous Infusions:  PRN Meds: acetaminophen **OR** acetaminophen, butamben-tetracaine-benzocaine, lidocaine, ondansetron **OR** ondansetron (ZOFRAN) IV   Vital Signs    Vitals:   07/18/22 1320 07/18/22 1330 07/18/22 1347 07/18/22 1413  BP: 92/67 96/72 106/81 110/78  Pulse: 61 62 62 64  Resp: (!) 22 (!) '21 18 16  '$ Temp:      TempSrc:      SpO2: 98% 96% 95% 100%  Weight:      Height:        Intake/Output Summary (Last 24 hours) at 07/18/2022 1517 Last data filed at 07/18/2022 1301 Gross per 24 hour  Intake 200 ml  Output --  Net 200 ml   Filed Weights   07/17/22 1012 07/18/22 1224  Weight: 76.7 kg 76.7 kg    Telemetry    Normal sinus rhythm with heart rate in the 60s and 70s bpm - Personally Reviewed  ECG    No new tracings - Personally Reviewed  Physical Exam   GEN: No acute distress.   Neck: No JVD. Cardiac: Regular rate and rhythm, no murmurs, rubs, or gallops.  Respiratory: Clear to auscultation bilaterally.   GI: Soft, nontender, non-distended.   MS: No edema; No deformity. Neuro:  Alert and oriented x 3; Nonfocal.  Psych: Normal affect.  Labs    Chemistry Recent Labs  Lab 07/14/22 1426 07/15/22 0537 07/16/22 1118  NA 135 137 139  K 4.0 3.9 3.5  CL 101 104 102  CO2 '22 22 26  '$ GLUCOSE 108* 95 95  BUN '14 12 15  '$ CREATININE 0.87 0.89 0.96  CALCIUM 10.2 9.2 9.1  GFRNONAA >60 >60 >60  ANIONGAP '12 11 11      '$ Hematology Recent Labs  Lab 07/14/22 1426 07/15/22 0537  WBC 7.4 6.2  RBC 4.30 3.82*  HGB 13.1 11.6*  HCT 40.6 36.5  MCV 94.4 95.5  MCH 30.5 30.4  MCHC 32.3 31.8  RDW 14.6 14.7  PLT 246 206     Cardiac EnzymesNo results for input(s): "TROPONINI" in the last 168 hours. No results for input(s): "TROPIPOC" in the last 168 hours.   BNP Recent Labs  Lab 07/14/22 1918  BNP 708.8*      DDimer No results for input(s): "DDIMER" in the last 168 hours.  Radiology    DG Chest 2 View  Result Date: 07/14/2022 IMPRESSION: Patchy and linear opacities in both lung bases, which may represent atelectasis or pneumonia. Electronically Signed   By: Ronney Asters M.D.   On: 07/14/2022 15:05    Cardiac Studies   2D echo 07/15/2022: 1. Left ventricular ejection fraction, by estimation, is 30 to 35%. The  left ventricle has moderately decreased function. The left ventricle  demonstrates global hypokinesis. Left ventricular diastolic parameters are  indeterminate.   2. Right ventricular systolic function is mildly reduced. The right  ventricular size is moderately enlarged. There is normal pulmonary artery  systolic pressure. The estimated right ventricular systolic pressure is  02.5 mmHg.   3. Left atrial size was moderately dilated.   4. Right atrial size was moderately dilated.   5. The mitral valve is normal in structure. Moderate mitral valve  regurgitation. No evidence of mitral stenosis.   6. Tricuspid valve regurgitation is moderate to severe.   7. The aortic valve  is normal in structure. Aortic valve regurgitation is  mild. Aortic valve sclerosis is present, with no evidence of aortic valve  stenosis.   8. There is borderline dilatation of the ascending aorta, measuring 38  mm.   9. The inferior vena cava is normal in size with greater than 50%  respiratory variability, suggesting right atrial pressure of 3 mmHg.   Patient Profile     67 y.o. female with history of coronary artery calcification noted on CT imaging, atrial flutter status post catheter ablation in 10/2018, PAF, solitary kidney with prior right nephrectomy, HTN, hypothyroidism, and prior tobacco use who is being seen today for the evaluation of A-fib with RVR at the request of Dr. Francine Graven.   Assessment & Plan    1.  A-fib with RVR: Status post successful TEE guided cardioversion today. -CHA2DS2-VASc 5 (CHF, HTN, age x 1, vascular disease, sex category)  -Continue Eliquis 5 mg bid  -TSH normal with mildly elevated free T4 -I discontinued digoxin. I consolidated Toprol to 100 mg once daily. The patient is at high risk for recurrent atrial fibrillation as she used to be on Multaq in the past and also with her new cardiomyopathy.  I discussed with her different antiarrhythmic options and I think the best option for now is to treat her short-term with amiodarone.  I discussed risks and benefits.  I started amiodarone at 200 mg twice daily.  Once her ejection fraction hopefully recovers while she is in sinus rhythm, different rhythm strategy can be considered.  2.  Acute HFrEF: -Possibly tachycardia mediated in the setting of persistent A-fib with RVR, though cannot exclude ischemic heart disease given risk factors as outlined below -Volume improving -I switch furosemide to 40 mg once daily. -Continue Toprol. -I will add low-dose losartan although we might be limited by low blood pressure. -Recommend repeat echocardiogram few weeks after getting her back in sinus rhythm to reevaluate the EF.   If EF remains low, she will require further ischemic cardiac evaluation.  3.  Coronary artery calcification with elevated high-sensitivity troponin: -CT imaging from 12/2021 notable for left main and three-vessel coronary artery calcification -Mildly elevated and flat trending high-sensitivity troponin likely representative of supply/demand ischemia in the setting of A-fib with RVR and not consistent with ACS -Further ischemic cardiac evaluation as an outpatient once we address the rhythm issue.  4.  History of atrial flutter: -Status post ablation in 2020  5.  HTN: -Blood pressure well-controlled -Medical  therapy as outlined above  6.  Solitary kidney: -Status post prior right nephrectomy -Monitor renal function.   If the patient remains in sinus rhythm, she can likely be discharged home tomorrow.  Total encounter time more than 50 minutes. Greater than 50% was spent in counseling and coordination of care with the patient.        For questions or updates, please contact [place] Please consult [email or web address] for contact info under Cardiology/STEMI.    Signed, Kathlyn Sacramento, MD [facility] 07/18/2022, 3:17 PM

## 2022-07-19 ENCOUNTER — Encounter: Payer: Self-pay | Admitting: Cardiovascular Disease

## 2022-07-19 DIAGNOSIS — I4891 Unspecified atrial fibrillation: Secondary | ICD-10-CM | POA: Diagnosis not present

## 2022-07-19 DIAGNOSIS — I429 Cardiomyopathy, unspecified: Secondary | ICD-10-CM

## 2022-07-19 LAB — BASIC METABOLIC PANEL WITH GFR
Anion gap: 7 (ref 5–15)
BUN: 17 mg/dL (ref 8–23)
CO2: 26 mmol/L (ref 22–32)
Calcium: 8.6 mg/dL — ABNORMAL LOW (ref 8.9–10.3)
Chloride: 101 mmol/L (ref 98–111)
Creatinine, Ser: 1.03 mg/dL — ABNORMAL HIGH (ref 0.44–1.00)
GFR, Estimated: >60 mL/min
Glucose, Bld: 94 mg/dL (ref 70–99)
Potassium: 3.7 mmol/L (ref 3.5–5.1)
Sodium: 134 mmol/L — ABNORMAL LOW (ref 135–145)

## 2022-07-19 LAB — ECHO TEE

## 2022-07-19 MED ORDER — AMIODARONE HCL 200 MG PO TABS: ORAL_TABLET | ORAL | 1 refills | Status: DC

## 2022-07-19 MED ORDER — AMIODARONE HCL 200 MG PO TABS: 200.0000 mg | ORAL_TABLET | Freq: Two times a day (BID) | ORAL | 1 refills | Status: DC

## 2022-07-19 MED ORDER — LOSARTAN POTASSIUM 25 MG PO TABS: 25.0000 mg | ORAL_TABLET | Freq: Every day | ORAL | 1 refills | Status: DC

## 2022-07-19 MED ORDER — APIXABAN 5 MG PO TABS: 5.0000 mg | ORAL_TABLET | Freq: Two times a day (BID) | ORAL | 1 refills | Status: DC

## 2022-07-19 MED ORDER — METOPROLOL SUCCINATE ER 100 MG PO TB24: 100.0000 mg | ORAL_TABLET | Freq: Every day | ORAL | 1 refills | Status: DC

## 2022-07-19 MED ORDER — ATORVASTATIN CALCIUM 40 MG PO TABS: 40.0000 mg | ORAL_TABLET | Freq: Every day | ORAL | 1 refills | Status: DC

## 2022-07-19 MED ORDER — LEVOTHYROXINE SODIUM 50 MCG PO TABS: 50.0000 ug | ORAL_TABLET | Freq: Every day | ORAL | 1 refills | Status: DC

## 2022-07-19 MED ORDER — FUROSEMIDE 40 MG PO TABS: 40.0000 mg | ORAL_TABLET | Freq: Every day | ORAL | 1 refills | Status: DC

## 2022-07-19 NOTE — Progress Notes (Signed)
Rounding Note    Patient Name: Tricia Alvarez Date of Encounter: 07/19/2022  [place] Cardiologist: Virl Axe, MD   Subjective   Denies chest pain or shortness of breath, denies palpitations.  Inpatient Medications    Scheduled Meds:  amiodarone  200 mg Oral BID   apixaban  5 mg Oral BID   vitamin C  2,000 mg Oral Daily   atorvastatin  40 mg Oral Daily   calcium carbonate  1 tablet Oral Daily   cholecalciferol  2,000 Units Oral Daily   folic acid  1 mg Oral Daily   furosemide  40 mg Oral Daily   levothyroxine  50 mcg Oral Q0600   losartan  25 mg Oral Daily   magnesium gluconate  500 mg Oral Daily   metoprolol succinate  100 mg Oral Daily   multivitamin with minerals  1 tablet Oral Daily   thiamine  100 mg Oral Daily   Or   thiamine  100 mg Intravenous Daily   Continuous Infusions:  PRN Meds: acetaminophen **OR** acetaminophen, ondansetron **OR** ondansetron (ZOFRAN) IV   Vital Signs    Vitals:   07/19/22 0100 07/19/22 0456 07/19/22 0927 07/19/22 1113  BP: 111/69 112/66 (!) 101/58 103/67  Pulse:  65 63 61  Resp: '19  20 18  '$ Temp: 97.7 F (36.5 C) 99.2 F (37.3 C) 98.3 F (36.8 C) 97.8 F (36.6 C)  TempSrc: Oral     SpO2: 93% 96% 91% 96%  Weight:      Height:        Intake/Output Summary (Last 24 hours) at 07/19/2022 1214 Last data filed at 07/18/2022 1600 Gross per 24 hour  Intake 440 ml  Output --  Net 440 ml      07/18/2022   12:24 PM 07/17/2022   10:12 AM 07/14/2022    1:27 PM  Last 3 Weights  Weight (lbs) 169 lb 1.5 oz 169 lb 1.5 oz 169 lb  Weight (kg) 76.7 kg 76.7 kg 76.658 kg      Telemetry    Sinus rhythm- Personally Reviewed  ECG     - Personally Reviewed  Physical Exam   GEN: No acute distress.   Neck: No JVD Cardiac: RRR, no murmurs, rubs, or gallops.  Respiratory: Clear to auscultation bilaterally. GI: Soft, nontender, non-distended  MS: No edema; No deformity. Neuro:  Nonfocal  Psych: Normal affect    Labs    High Sensitivity Troponin:   Recent Labs  Lab 07/14/22 1426 07/14/22 1700 07/14/22 1918  TROPONINIHS 22* 19* 23*     Chemistry Recent Labs  Lab 07/14/22 1700 07/15/22 0537 07/16/22 1118 07/19/22 0331  NA  --  137 139 134*  K  --  3.9 3.5 3.7  CL  --  104 102 101  CO2  --  '22 26 26  '$ GLUCOSE  --  95 95 94  BUN  --  '12 15 17  '$ CREATININE  --  0.89 0.96 1.03*  CALCIUM  --  9.2 9.1 8.6*  MG 1.4*  --   --   --   GFRNONAA  --  >60 >60 >60  ANIONGAP  --  '11 11 7    '$ Lipids No results for input(s): "CHOL", "TRIG", "HDL", "LABVLDL", "LDLCALC", "CHOLHDL" in the last 168 hours.  Hematology Recent Labs  Lab 07/14/22 1426 07/15/22 0537  WBC 7.4 6.2  RBC 4.30 3.82*  HGB 13.1 11.6*  HCT 40.6 36.5  MCV 94.4 95.5  MCH  30.5 30.4  MCHC 32.3 31.8  RDW 14.6 14.7  PLT 246 206   Thyroid  Recent Labs  Lab 07/14/22 1426  TSH 3.347  FREET4 1.42*    BNP Recent Labs  Lab 07/14/22 1918  BNP 708.8*    DDimer No results for input(s): "DDIMER" in the last 168 hours.   Radiology    No results found.  Cardiac Studies   TTEcho 07/15/2022: 1. Left ventricular ejection fraction, by estimation, is 30 to 35%. The  left ventricle has moderately decreased function. The left ventricle  demonstrates global hypokinesis. Left ventricular diastolic parameters are  indeterminate.   2. Right ventricular systolic function is mildly reduced. The right  ventricular size is moderately enlarged. There is normal pulmonary artery  systolic pressure. The estimated right ventricular systolic pressure is  58.0 mmHg.   3. Left atrial size was moderately dilated.   4. Right atrial size was moderately dilated.   5. The mitral valve is normal in structure. Moderate mitral valve  regurgitation. No evidence of mitral stenosis.   6. Tricuspid valve regurgitation is moderate to severe.   7. The aortic valve is normal in structure. Aortic valve regurgitation is  mild. Aortic valve sclerosis is  present, with no evidence of aortic valve  stenosis.   8. There is borderline dilatation of the ascending aorta, measuring 38  mm.   9. The inferior vena cava is normal in size with greater than 50%  respiratory variability, suggesting right atrial pressure of 3 mmHg.   Patient Profile     67 y.o. female atrial flutter s/p RFA 10/2018, paroxysmal atrial fibrillation, hypertension, solitary kidney being seen for A-fib RVR.  Underwent DC cardioversion successfully, maintaining sinus rhythm.  Assessment & Plan    A-fib RVR s/p DC cardioversion -Maintaining sinus rhythm -Continue amiodarone 200 mg twice daily x 7 days, reduce to 200 mg daily after. -Continue Toprol-XL, Eliquis -Follow-up with EP/Dr. Caryl Comes in 1 to 2 weeks upon discharge.  2.  Cardiomyopathy EF 35% -Possibly tachycardia induced -Repeat echo as outpatient, consider ischemic workup if EF stays low while patient in sinus rhythm -Continue Toprol-XL, losartan 25, Lasix 40 mg daily. -Patient is euvolemic.  Okay for discharge from a cardiac perspective on current meds.  Close follow-up with EP upon discharge.  Total encounter time more than 50 minutes  Greater than 50% was spent in counseling and coordination of care with the patient     Signed, Kate Sable, MD  07/19/2022, 12:14 PM

## 2022-07-19 NOTE — Progress Notes (Signed)
DC instructions, med list, CHF teaching, afib and cardioversion printouts reviewed with pt and her son.  Pt and son verbalized understanding.  IV removed, tele monitor removed.  Pt has f/u appts with PCP, cards, and the heart failure clinic.  Pt discharged via wheelchair without incident

## 2022-07-19 NOTE — Discharge Summary (Signed)
Physician Discharge Summary  Georgianna Band XNT:[id number] DOB: 09/01/1955 DOA: 07/14/2022  PCP: Kirk Ruths, MD  Admit date: 07/14/2022  Discharge date: 07/19/2022  Admitted From: Home.  Disposition:  Home.  Recommendations for Outpatient Follow-up:  Follow up with PCP in 1-2 weeks. Please obtain BMP/CBC in one week. Advised to follow-up with Cardiology in 2 weeks. Patient s/p successful TEE with DCCV to NSR. Advised to take amiodarone 200 mg twice daily for 7 days and then amiodarone 200 mg daily.  Discharge medications: Lasix 40 mg daily. Metoprolol 100 mg daily. Losartan 25 mg daily Amiodarone 200 mg twice daily Eliquis 5 mg twice daily Lipitor 40 mg daily  Home Health:None Equipment/Devices:None  Discharge Condition: Stable CODE STATUS:Full code Diet recommendation: Heart Healthy   Brief [facility] Course: This 67 y.o. female with medical history significant for atrial fibrillation treated in the past with dronedarone and then catheter ablation (Eliquis and metoprolol were discontinued in 03/21), history of hypothyroidism, alcohol dependence, status post nephrectomy who presents to the ER from the cardiology clinic for evaluation of rapid A-fib. She was noted to have heart rate in the 170s during her cardiology appointment.  Patient has developed palpitations associated with exertional shortness of breath for last 5 days.  She was seen by gynecologist 3-day prior to admission and was advised to follow-up with cardiologist because her physical exam consistent with irregular tachycardia.  Patient also developed orthopnea, not able to lie flat and been sleeping in the recliner.  She has lower extremity swelling and has gained 10 pounds in last 1 month.  EKG shows A-fib with RVR.  Patient was given 2 doses of IV Cardizem without any improvement.  Subsequently she was started on Cardizem drip. Admitted to telemetry for further evaluation. Cardiology was consulted. Echo  showed Low LVEF, Cardizem discontinued, started on metoprolol, She was tried on digoxin as well but her heart rate remains elevated, associated with palpitations, She underwent successful TEE/DCCV to NSR. She remains Normal sinus rhythm, cleared from cardiology to be discharged. She was started on amiodarone 200 mg q12hr  for 7 days and then 200 mg daily. Patient  feels better and is being discharged home.  Discharge Diagnoses:  Principal Problem:   Atrial fibrillation with RVR (HCC) Active Problems:   Acute diastolic CHF (congestive heart failure) (HCC)   Hypothyroidism   Alcohol dependence ([place])   Essential hypertension   History of atrial flutter   Dilated cardiomyopathy (HCC)   Cardiomyopathy ([place])  Atrial fibrillation with RVR: Patient with PMH of atrial fibrillation s/p catheter ablation and has been off antiarrhythmic agents for several years as well as Eliquis.  She presented in the ED for the evaluation of exertional shortness of breath, palpitations, dizziness and intermittent chest discomfort. She was found to be tachycardic with heart rate in 170s and EKG showed rapid A-fib. She received 2 doses of IV Cardizem without improvement in her heart rate and started on Cardizem drip Cardizem discontinued given cardiomyopathy showing low LVEF 30 to 35%. Started on metoprolol 50 mg every 12 hours for heart rate control. 2D echocardiogram shows EF 30 to 35%.  LV function mildly decreased. Cardiology is consulted. Patient started on Eliquis.   She continued to have palpitations while ambulation where heart rate up to 120s. Cardiology started digoxin in addition to metoprolol. Her heart rate continues to remain elevated despite being on metoprolol and digoxin. Patient underwent successful TEE with cardioversion to normal sinus rhythm  07/18/22. She is cleared from cardiology  to be discharged.   ETOH depandence: Patient admits to daily alcohol use but denies having symptoms of alcohol  withdrawal when she does not drink. Continue CIWA protocol and administer lorazepam as needed. Continue thiamine, folic acid and multivitamins.   Hypothyroidism: Patient has been on Synthroid 75 mcg and labs show normal TSH with elevated free thyroxine level This could also be contributing to her rapid A-fib Decrease Synthroid to 50 mcg daily Repeat TSH and free thyroxine levels in 6 to 8 weeks.   Acute on chronic systolic CHF. Most likely related to A-fib with RVR. Symptoms include exertional shortness of breath, 3 pillow orthopnea and bilateral lower extremity swelling 2D echocardiogram shows LVEF 30 to 35%. Continue Lasix 40 mg IV daily Cardizem discontinued. Metoprolol Increased to 100 mg daily. Cardiology is consulted.  Medication adjusted. No plans for any ischemic evaluation at this time.   Essential hypertension.: Blood pressure is controlled.  Continue metoprolol.   Solitary kidney: S/p right nephrectomy Continue to monitor renal functions.  Discharge Instructions  Discharge Instructions     Call MD for:  difficulty breathing, headache or visual disturbances   Complete by: As directed    Call MD for:  persistant dizziness or light-headedness   Complete by: As directed    Call MD for:  persistant nausea and vomiting   Complete by: As directed    Diet - low sodium heart healthy   Complete by: As directed    Diet Carb Modified   Complete by: As directed    Discharge instructions   Complete by: As directed    Advised to follow-up with primary care physician in 1 week. Advised to follow-up with cardiology in 4 weeks. Patient is s/p TEE with DCCV to normal sinus rhythm. Discharge medications: Lasix 40 mg daily Metoprolol 100 mg daily Losartan 25 mg daily Amiodarone 200 mg twice daily Eliquis 5 mg twice daily Lipitor 40 mg daily   Increase activity slowly   Complete by: As directed       Allergies as of 07/19/2022       Reactions   Morphine And Related  Nausea And Vomiting   Nausea  vomiting  headache        Medication List     TAKE these medications    amiodarone 200 MG tablet Commonly known as: PACERONE Advised to take amiodarone 200 mg twice daily for 7 days followed by 200 mg daily.   apixaban 5 MG Tabs tablet Commonly known as: ELIQUIS Take 1 tablet (5 mg total) by mouth 2 (two) times daily.   atorvastatin 40 MG tablet Commonly known as: LIPITOR Take 1 tablet (40 mg total) by mouth daily. Start taking on: July 20, 2022   calcium carbonate 1500 (600 Ca) MG Tabs tablet Commonly known as: OSCAL Take 1 tablet by mouth daily.   cholecalciferol 25 MCG (1000 UNIT) tablet Commonly known as: VITAMIN D3 Take 2,000 Units by mouth daily.   furosemide 40 MG tablet Commonly known as: LASIX Take 1 tablet (40 mg total) by mouth daily. Start taking on: July 20, 2022   levothyroxine 50 MCG tablet Commonly known as: SYNTHROID Take 1 tablet (50 mcg total) by mouth daily at 6 (six) AM. Start taking on: July 20, 2022 What changed:  medication strength how much to take when to take this   losartan 25 MG tablet Commonly known as: COZAAR Take 1 tablet (25 mg total) by mouth daily. Start taking on: July 20, 2022   magnesium gluconate  500 MG tablet Commonly known as: MAGONATE Take 500 mg by mouth daily.   metoprolol succinate 100 MG 24 hr tablet Commonly known as: TOPROL-XL Take 1 tablet (100 mg total) by mouth daily. Take with or immediately following a meal. Start taking on: July 20, 2022   multivitamin tablet Take 1 tablet by mouth daily.   vitamin C 1000 MG tablet Take 2,000 mg by mouth daily.        Follow-up Information     Kirk Ruths, MD. Go in 1 week(s).   Specialty: Internal Medicine Why: Appointment on Monday, 07/24/2022 at 2:15pm. Contact information: [place] [address] [phone]         Deboraha Sprang, MD Follow up in  4 week(s).   Specialty: Cardiology Contact information: [place] [address] [phone]                Allergies  Allergen Reactions   Morphine And Related Nausea And Vomiting    Nausea  vomiting  headache    Consultations: Cardiology   Procedures/Studies: ECHOCARDIOGRAM COMPLETE  Result Date: 07/15/2022    ECHOCARDIOGRAM REPORT   Patient Name:   [facility] Date of Exam: 07/15/2022 Medical Rec #:  [id number]       Height:       67.0 in Accession #:    [id number]      Weight:       169.0 lb Date of Birth:  12/10/1955        BSA:          1.883 m Patient Age:    67 years        BP:           116/98 mmHg Patient Gender: F               HR:           98 bpm. Exam Location:  [facility] Procedure: 2D Echo, Color Doppler, Cardiac Doppler and Intracardiac            Opacification Agent Indications:     Atrial Fibrillation  History:         Patient has no prior history of Echocardiogram examinations.                  Arrythmias:Atrial Fibrillation.  Sonographer:     L. Thornton-Maynard Referring Phys:  [id number] AGBATA Diagnosing Phys: Ida Rogue MD  Sonographer Comments: Suboptimal apical window. IMPRESSIONS  1. Left ventricular ejection fraction, by estimation, is 30 to 35%. The left ventricle has moderately decreased function. The left ventricle demonstrates global hypokinesis. Left ventricular diastolic parameters are indeterminate.  2. Right ventricular systolic function is mildly reduced. The right ventricular size is moderately enlarged. There is normal pulmonary artery systolic pressure. The estimated right ventricular systolic pressure is 87.6 mmHg.  3. Left atrial size was moderately dilated.  4. Right atrial size was moderately dilated.  5. The mitral valve is normal in structure. Moderate mitral valve regurgitation. No evidence of mitral stenosis.  6. Tricuspid valve regurgitation is moderate to severe.  7. The aortic valve is normal in  structure. Aortic valve regurgitation is mild. Aortic valve sclerosis is present, with no evidence of aortic valve stenosis.  8. There is borderline dilatation of the ascending aorta, measuring 38 mm.  9. The inferior vena cava is normal in size with greater than 50% respiratory variability, suggesting  right atrial pressure of 3 mmHg. FINDINGS  Left Ventricle: Left ventricular ejection fraction, by estimation, is 30 to 35%. The left ventricle has moderately decreased function. The left ventricle demonstrates global hypokinesis. Definity contrast agent was given IV to delineate the left ventricular endocardial borders. The left ventricular internal cavity size was normal in size. There is no left ventricular hypertrophy. Left ventricular diastolic parameters are indeterminate. Right Ventricle: The right ventricular size is moderately enlarged. No increase in right ventricular wall thickness. Right ventricular systolic function is mildly reduced. There is normal pulmonary artery systolic pressure. The tricuspid regurgitant velocity is 2.34 m/s, and with an assumed right atrial pressure of 8 mmHg, the estimated right ventricular systolic pressure is 09.3 mmHg. Left Atrium: Left atrial size was moderately dilated. Right Atrium: Right atrial size was moderately dilated. Pericardium: There is no evidence of pericardial effusion. Mitral Valve: The mitral valve is normal in structure. Moderate mitral valve regurgitation. No evidence of mitral valve stenosis. MV peak gradient, 3.5 mmHg. The mean mitral valve gradient is 1.5 mmHg. Tricuspid Valve: The tricuspid valve is normal in structure. Tricuspid valve regurgitation is moderate to severe. No evidence of tricuspid stenosis. Aortic Valve: The aortic valve is normal in structure. Aortic valve regurgitation is mild. Aortic valve sclerosis is present, with no evidence of aortic valve stenosis. Aortic valve mean gradient measures 2.0 mmHg. Aortic valve peak gradient measures  4.0  mmHg. Aortic valve area, by VTI measures 2.01 cm. Pulmonic Valve: The pulmonic valve was normal in structure. Pulmonic valve regurgitation is trivial. No evidence of pulmonic stenosis. Aorta: The aortic root is normal in size and structure. There is borderline dilatation of the ascending aorta, measuring 38 mm. Venous: The inferior vena cava is normal in size with greater than 50% respiratory variability, suggesting right atrial pressure of 3 mmHg. IAS/Shunts: No atrial level shunt detected by color flow Doppler.  LEFT VENTRICLE PLAX 2D LVIDd:         5.00 cm      Diastology LVIDs:         4.40 cm      LV e' medial:    7.94 cm/s LV PW:         0.70 cm      LV E/e' medial:  10.3 LV IVS:        1.10 cm      LV e' lateral:   11.10 cm/s LVOT diam:     2.10 cm      LV E/e' lateral: 7.4 LV SV:         27 LV SV Index:   14 LVOT Area:     3.46 cm  LV Volumes (MOD) LV vol d, MOD A2C: 106.0 ml LV vol d, MOD A4C: 55.4 ml LV vol s, MOD A2C: 50.5 ml LV vol s, MOD A4C: 37.3 ml LV SV MOD A2C:     55.5 ml LV SV MOD A4C:     55.4 ml LV SV MOD BP:      31.5 ml RIGHT VENTRICLE            IVC RV Basal diam:  4.40 cm    IVC diam: 3.10 cm RV S prime:     7.40 cm/s TAPSE (M-mode): 1.1 cm LEFT ATRIUM              Index        RIGHT ATRIUM           Index LA diam:  4.10 cm  2.18 cm/m   RA Area:     30.40 cm LA Vol (A2C):   108.0 ml 57.37 ml/m  RA Volume:   109.00 ml 57.90 ml/m LA Vol (A4C):   95.2 ml  50.57 ml/m LA Biplane Vol: 103.0 ml 54.71 ml/m  AORTIC VALVE                    PULMONIC VALVE AV Area (Vmax):    1.94 cm     PV Vmax:          0.49 m/s AV Area (Vmean):   1.88 cm     PV Peak grad:     1.0 mmHg AV Area (VTI):     2.01 cm     PR End Diast Vel: 5.02 msec AV Vmax:           99.50 cm/s AV Vmean:          67.600 cm/s AV VTI:            0.132 m AV Peak Grad:      4.0 mmHg AV Mean Grad:      2.0 mmHg LVOT Vmax:         55.80 cm/s LVOT Vmean:        36.700 cm/s LVOT VTI:          0.077 m LVOT/AV VTI ratio: 0.58   AORTA Ao Root diam: 3.30 cm Ao Asc diam:  3.80 cm MITRAL VALVE                  TRICUSPID VALVE MV Area (PHT): 6.83 cm       TR Peak grad:   21.9 mmHg MV Area VTI:   2.13 cm       TR Vmax:        234.00 cm/s MV Peak grad:  3.5 mmHg MV Mean grad:  1.5 mmHg       SHUNTS MV Vmax:       0.93 m/s       Systemic VTI:  0.08 m MV Vmean:      64.0 cm/s      Systemic Diam: 2.10 cm MV Decel Time: 111 msec MR Peak grad:    88.4 mmHg MR Mean grad:    58.7 mmHg MR Vmax:         470.00 cm/s MR Vmean:        364.0 cm/s MR PISA:         2.02 cm MR PISA Eff ROA: 17 mm MR PISA Radius:  0.57 cm MV E velocity: 82.00 cm/s Ida Rogue MD Electronically signed by Ida Rogue MD Signature Date/Time: 07/15/2022/2:15:05 PM    Final    DG Chest 2 View  Result Date: 07/14/2022 CLINICAL DATA:  Chest pain EXAM: CHEST - 2 VIEW COMPARISON:  Chest x-ray 01/20/2022 FINDINGS: There is some patchy and linear opacities in both lung bases. The heart is mildly enlarged. There is no pleural effusion or pneumothorax. No acute fractures are identified. IMPRESSION: Patchy and linear opacities in both lung bases, which may represent atelectasis or pneumonia. Electronically Signed   By: Ronney Asters M.D.   On: 07/14/2022 15:05   TEE with DCCV   Subjective: Patient seen and examined at bedside.  Overnight events noted.  She underwent successful DCCV to normal sinus rhythm.  Patient feels better and wants to discharged home.  Discharge Exam: Vitals:   07/19/22 0927 07/19/22 1113  BP: (!) 101/58 103/67  Pulse: 63 61  Resp: 20 18  Temp: 98.3 F (36.8 C) 97.8 F (36.6 C)  SpO2: 91% 96%   Vitals:   07/19/22 0100 07/19/22 0456 07/19/22 0927 07/19/22 1113  BP: 111/69 112/66 (!) 101/58 103/67  Pulse:  65 63 61  Resp: '19  20 18  '$ Temp: 97.7 F (36.5 C) 99.2 F (37.3 C) 98.3 F (36.8 C) 97.8 F (36.6 C)  TempSrc: Oral     SpO2: 93% 96% 91% 96%  Weight:      Height:        General: Pt is alert, awake, not in acute  distress Cardiovascular: RRR, S1/S2 +, no rubs, no gallops Respiratory: CTA bilaterally, no wheezing, no rhonchi Abdominal: Soft, NT, ND, bowel sounds + Extremities: no edema, no cyanosis    The results of significant diagnostics from this hospitalization (including imaging, microbiology, ancillary and laboratory) are listed below for reference.     Microbiology: No results found for this or any previous visit (from the past 240 hour(s)).   Labs: BNP (last 3 results) Recent Labs    07/14/22 1918  BNP 443.1*   Basic Metabolic Panel: Recent Labs  Lab 07/14/22 1426 07/14/22 1700 07/15/22 0537 07/16/22 1118 07/19/22 0331  NA 135  --  137 139 134*  K 4.0  --  3.9 3.5 3.7  CL 101  --  104 102 101  CO2 22  --  '22 26 26  '$ GLUCOSE 108*  --  95 95 94  BUN 14  --  '12 15 17  '$ CREATININE 0.87  --  0.89 0.96 1.03*  CALCIUM 10.2  --  9.2 9.1 8.6*  MG  --  1.4*  --   --   --    Liver Function Tests: No results for input(s): "AST", "ALT", "ALKPHOS", "BILITOT", "PROT", "ALBUMIN" in the last 168 hours. No results for input(s): "LIPASE", "AMYLASE" in the last 168 hours. No results for input(s): "AMMONIA" in the last 168 hours. CBC: Recent Labs  Lab 07/14/22 1426 07/15/22 0537  WBC 7.4 6.2  HGB 13.1 11.6*  HCT 40.6 36.5  MCV 94.4 95.5  PLT 246 206   Cardiac Enzymes: No results for input(s): "CKTOTAL", "CKMB", "CKMBINDEX", "TROPONINI" in the last 168 hours. BNP: Invalid input(s): "POCBNP" CBG: No results for input(s): "GLUCAP" in the last 168 hours. D-Dimer No results for input(s): "DDIMER" in the last 72 hours. Hgb A1c No results for input(s): "HGBA1C" in the last 72 hours. Lipid Profile No results for input(s): "CHOL", "HDL", "LDLCALC", "TRIG", "CHOLHDL", "LDLDIRECT" in the last 72 hours. Thyroid function studies No results for input(s): "TSH", "T4TOTAL", "T3FREE", "THYROIDAB" in the last 72 hours.  Invalid input(s): "FREET3" Anemia work up No results for input(s):  "VITAMINB12", "FOLATE", "FERRITIN", "TIBC", "IRON", "RETICCTPCT" in the last 72 hours. Urinalysis    Component Value Date/Time   COLORURINE YELLOW (A) 02/05/2020 1256   APPEARANCEUR HAZY (A) 02/05/2020 1256   LABSPEC 1.010 02/05/2020 1256   PHURINE 7.0 02/05/2020 1256   GLUCOSEU NEGATIVE 02/05/2020 1256   HGBUR NEGATIVE 02/05/2020 1256   BILIRUBINUR NEGATIVE 02/05/2020 1256   KETONESUR NEGATIVE 02/05/2020 1256   PROTEINUR NEGATIVE 02/05/2020 1256   NITRITE NEGATIVE 02/05/2020 1256   LEUKOCYTESUR NEGATIVE 02/05/2020 1256   Sepsis Labs Recent Labs  Lab 07/14/22 1426 07/15/22 0537  WBC 7.4 6.2   Microbiology No results found for this or any previous visit (from the past 240 hour(s)).   Time coordinating discharge: Over 30 minutes  SIGNED:  Shawna Clamp, MD  [facility] 07/19/2022, 1:45 PM Pager   If 7PM-7AM, please contact night-coverage

## 2022-07-24 DIAGNOSIS — E039 Hypothyroidism, unspecified: Secondary | ICD-10-CM | POA: Diagnosis not present

## 2022-07-24 DIAGNOSIS — I4891 Unspecified atrial fibrillation: Secondary | ICD-10-CM | POA: Diagnosis not present

## 2022-07-24 NOTE — Progress Notes (Deleted)
   Patient ID: Tricia Alvarez, female    DOB: 10/01/1955, 66 y.o.   MRN: [id number]  HPI  Tricia Alvarez is a 67  y/o female with a history of  TEE 07/18/22 showed EF of 30-35%, no thrombus and mild MR. Echo 07/15/22 showed EF of 30-35% with normal PA pressure (29.9 mmHg), moderate LAE/RAE, moderate MR and mod/severe TR.   Admitted 07/14/22 due to AF RVR along with a/c heart failure. Diuresed. Tried on cardizem/ digoxin. Successful cardioversion done. Amiodarone given.   She presents today for her initial visit with a chief complaint of   Review of Systems    Physical Exam    Assessment & Plan:  1: Chronic heart failure with reduced ejection fraction-  -NYHA class - GDMT of - BNP 07/14/22 was 708.8  2: HTN- - BP - saw PCP Tricia Alvarez) 07/24/22 - BMP 07/19/22 showed sodium 134, potassium 3.7, creatinine 1.03 & GFR >60  3: AF- - recent TEE/ cardioversion 06/2022 - previous dronedarone and ablation - saw cardiology Tricia Alvarez) 07/14/22; returns 08/14/22  4: Alcohol use- - LDL 01/03/22 was 78 - LFT's 01/03/22 were normal

## 2022-07-25 ENCOUNTER — Encounter: Payer: Medicare Other | Admitting: Family

## 2022-08-11 NOTE — Progress Notes (Unsigned)
Cardiology Office Note:    Date:  08/14/2022   ID:  Tricia Alvarez, DOB October 08, 1955, MRN [id number]  PCP:  Tricia Ruths, MD  [facility] Cardiologist:  Tricia Axe, MD  [facility] Electrophysiologist:  None   Referring MD: Tricia Ruths, MD   Chief Complaint: Hospital follow-up  History of Present Illness:    Tricia Alvarez is a 67 y.o. female with a hx of coronary artery calcification noted on CT imaging, atrial flutter s/p catheter ablation in 10/2018, PAF, solitary kidney with prior right nephrectomy, HTN, hypothyroidism, and prior tobacco use who is being seen for hospital follow-up.   The patient was previously followed by EP in [place]. She was diagnosed with atrial flutter in 07/2018, at which time she was on a cruise ship became presyncopal resulting in a fall and brief loss of consciousness.  Records indicated that she was in a narrow complex tachycardia at 149 bpm that did not terminate with Valsalva.  She was treated with adenosine with clear demonstration of atrial flutter waves.  She subsequently underwent cardioversion and followed up with her doctors and Tricia Alvarez.  Decision was made to pursue catheter ablation.  In 09/2018, prior to ablation, she was seen in the ED secondary to tachypalpitations and found to be in A-fib.  She was post undergo TEE guided cardioversion, however a small left atrial appendage thrombus was noted in cardioversion was canceled.  She was placed on Eliquis and subsequently converted to sinus rhythm on her own.  She was then placed on Multaq without recurrence of A-fib.  Atrial flutter ablation was carried out in May 2020.  In early 2021, she underwent Holter monitor which showed no recurrence of atrial arrhythmias leading Multaq and Eliquis to be discontinued.  Patient established with Dr. Caryl Alvarez in January 2022, last seeing him in June 2023 at which time she was without symptoms or angina decompensation.  Was admitted from the cardiology  office in late January for atrial fibrillation with RVR with rates up to 190s.  High-sensitivity troponin was 22, 19, 23.  BNP 708.  Patient was started on IV Dilt with improvement of heart rates. She was diuresed.  Echo showed EF 30 to 35% with global hypokinesis, mildly reduced RV systolic function with moderately large ventricular cavity size, normal PASP, moderate biatrial enlargement, moderate mitral regurgitation, moderate to severe tricuspid regurgitation, aortic valve sclerosis without evidence of stenosis, borderline dilation of the ascending aorta measuring 38 mm.  Cardizem was changed to Toprol.  Patient was started on digoxin.  Patient ultimately underwent successful TEE guided cardioversion.  Patient was started on amiodarone.  IV Lasix was switched to Lasix 40 mg daily.  Patient was started on losartan.  Today, the patient is overall doing well. She is in sinus bradycardia with a heart rate of 52bpm. She denies chest pain, SOB, LLE, orthopnea, pnd, or palpitations. She has questions regarding amiodarone. She has been taking amiodarone 279m twice daily, it looks like she should be taking once daily. She is taking Eliquis as prescribed. She said she was referred to the heart failure clinic, but did not want to go. She is not wanting to take the Lipitor so PCP discontinued this. She is taking the lasix daily. She quit smoking in August 2023 and has gained 10-20lbs since that time.   Past Medical History:  Diagnosis Date   Arthritis    [place]    a. 07/[place] - occurred on cruise ship. Flutter waves seen  after adenosine->DCCV on ship; b. 10/2018 s/p RFCA.   Cancer ([place])    skin and vulvular   History of cardiac radiofrequency ablation    Hypothyroidism    Osteopenia    Osteoporosis    PAF (paroxysmal atrial fibrillation) ([place])    a. prev on dronedarone; b. 08/2019 Holter: No afib-->eliquis d/c'd.   Renal disorder    right nephrectomy    Past Surgical History:  Procedure  Laterality Date   cardiac radiofrequency ablation     CARDIOVERSION N/A 07/18/2022   Procedure: CARDIOVERSION;  Surgeon: Tricia Merritts, MD;  Location: [facility] ORS;  Service: Cardiovascular;  Laterality: N/A;   CARPAL TUNNEL RELEASE Bilateral    CESAREAN SECTION     COLONOSCOPY WITH PROPOFOL N/A 05/25/2021   Procedure: COLONOSCOPY WITH PROPOFOL;  Surgeon: Tricia Alvarez, Tricia Pike, MD;  Location: [facility] ENDOSCOPY;  Service: Gastroenterology;  Laterality: N/A;   TEE WITHOUT CARDIOVERSION N/A 07/18/2022   Procedure: TRANSESOPHAGEAL ECHOCARDIOGRAM (TEE);  Surgeon: Tricia Merritts, MD;  Location: [facility] ORS;  Service: Cardiovascular;  Laterality: N/A;    Current Medications: Current Meds  Medication Sig   apixaban (ELIQUIS) 5 MG TABS tablet Take 1 tablet (5 mg total) by mouth 2 (two) times daily.   Ascorbic Acid (VITAMIN C) 1000 MG tablet Take 2,000 mg by mouth daily.   calcium carbonate (OSCAL) 1500 (600 Ca) MG TABS tablet Take 1 tablet by mouth daily.   Cholecalciferol (VITAMIN D) 50 MCG (2000 UT) CAPS Take 2,000 Units by mouth daily.   furosemide (LASIX) 40 MG tablet Take 1 tablet (40 mg total) by mouth daily.   levothyroxine (SYNTHROID) 75 MCG tablet Take 75 mcg by mouth daily before breakfast.   magnesium gluconate (MAGONATE) 500 MG tablet Take 500 mg by mouth daily.   metoprolol succinate (TOPROL-XL) 100 MG 24 hr tablet Take 1 tablet (100 mg total) by mouth daily. Take with or immediately following a meal.   Multiple Vitamin (MULTIVITAMIN) tablet Take 1 tablet by mouth daily.   sacubitril-valsartan (ENTRESTO) 24-26 MG Take 1 tablet by mouth 2 (two) times daily.   [DISCONTINUED] amiodarone (PACERONE) 200 MG tablet Advised to take amiodarone 200 mg twice daily for 7 days followed by 200 mg daily.   [DISCONTINUED] losartan (COZAAR) 25 MG tablet Take 1 tablet (25 mg total) by mouth daily.     Allergies:   Morphine and related   Social History   Socioeconomic History   Marital status: Divorced     Spouse name: Not on file   Number of children: Not on file   Years of education: Not on file   Highest education level: Not on file  Occupational History   Not on file  Tobacco Use   Smoking status: Former    Types: Cigarettes    Quit date: 02/27/2022    Years since quitting: 0.4   Smokeless tobacco: Never  Vaping Use   Vaping Use: Never used  Substance and Sexual Activity   Alcohol use: Yes    Comment: socially   Drug use: Never   Sexual activity: Not on file  Other Topics Concern   Not on file  Social History Narrative   Lives locally.  Active without routinely exercising.   Social Determinants of Health   Financial Resource Strain: Not on file  Food Insecurity: Unknown (07/15/2022)   Hunger Vital Sign    Worried About Running Out of Food in the Last Year: Patient refused    [place] in the Last  Year: Patient refused  Transportation Needs: Not on file  Physical Activity: Not on file  Stress: Not on file  Social Connections: Not on file     Family History: The patient's family history includes Breast cancer in her cousin.  ROS:   Please see the history of present illness.     All other systems reviewed and are negative.  EKGs/Labs/Other Studies Reviewed:    The following studies were reviewed today:  Echo TEE 07/18/22 IMPRESSIONS     1. Left ventricular ejection fraction, by estimation, is 30 to 35%. The  left ventricle has moderately decreased function. The left ventricle  demonstrates global hypokinesis.   2. Right ventricular systolic function is mildly reduced. The right  ventricular size is normal.   3. Left atrial size was moderately dilated. No left atrial/left atrial  appendage thrombus was detected.   4. The mitral valve is normal in structure. Mild mitral valve  regurgitation. No evidence of mitral stenosis.   5. The aortic valve is normal in structure. Aortic valve regurgitation is  mild. No aortic stenosis is present.   6. The inferior  vena cava is normal in size with greater than 50%  respiratory variability, suggesting right atrial pressure of 3 mmHg.   7. Agitated saline contrast bubble study was negative, with no evidence  of any interatrial shunt.   Conclusion(s)/Recommendation(s): Normal biventricular function without  evidence of hemodynamically significant valvular heart disease.  Echo 07/15/22   1. Left ventricular ejection fraction, by estimation, is 30 to 35%. The  left ventricle has moderately decreased function. The left ventricle  demonstrates global hypokinesis. Left ventricular diastolic parameters are  indeterminate.   2. Right ventricular systolic function is mildly reduced. The right  ventricular size is moderately enlarged. There is normal pulmonary artery  systolic pressure. The estimated right ventricular systolic pressure is  [id number] mmHg.   3. Left atrial size was moderately dilated.   4. Right atrial size was moderately dilated.   5. The mitral valve is normal in structure. Moderate mitral valve  regurgitation. No evidence of mitral stenosis.   6. Tricuspid valve regurgitation is moderate to severe.   7. The aortic valve is normal in structure. Aortic valve regurgitation is  mild. Aortic valve sclerosis is present, with no evidence of aortic valve  stenosis.   8. There is borderline dilatation of the ascending aorta, measuring 38  mm.   9. The inferior vena cava is normal in size with greater than 50%  respiratory variability, suggesting right atrial pressure of 3 mmHg.    EKG:  EKG is  ordered today.  The ekg ordered today demonstrates AB 52bpm, LAD, nonspecific T wave changes inf leads  Recent Labs: 07/14/2022: B Natriuretic Peptide 708.8; Magnesium 1.4; TSH 3.347 07/15/2022: Hemoglobin 11.6; Platelets 206 07/19/2022: BUN 17; Creatinine, Ser 1.03; Potassium 3.7; Sodium 134  Recent Lipid Panel No results found for: "CHOL", "TRIG", "HDL", "CHOLHDL", "VLDL", "LDLCALC", "LDLDIRECT"  Physical  Exam:    VS:  BP (!) 144/82 (BP Location: Left Arm, Patient Position: Sitting, Cuff Size: Normal)   Pulse (!) 52   Ht 5' 7"$  (1.702 m)   Wt 165 lb 12.8 oz (75.2 kg)   SpO2 97%   BMI 25.97 kg/m     Wt Readings from Last 3 Encounters:  08/14/22 165 lb 12.8 oz (75.2 kg)  07/18/22 169 lb 1.5 oz (76.7 kg)  07/14/22 169 lb (76.7 kg)     GEN:  Well nourished, well developed in  no acute distress HEENT: Normal NECK: No JVD; No carotid bruits LYMPHATICS: No lymphadenopathy CARDIAC: bradycardia, RR, no murmurs, rubs, gallops RESPIRATORY:  Clear to auscultation without rales, wheezing or rhonchi  ABDOMEN: Soft, non-tender, non-distended MUSCULOSKELETAL:  No edema; No deformity  SKIN: Warm and dry NEUROLOGIC:  Alert and oriented x 3 PSYCHIATRIC:  Normal affect   ASSESSMENT:    1. PAF (paroxysmal atrial fibrillation) ([place])   2. Chronic systolic heart failure (HCC)   3. Elevated troponin   4. Coronary artery calcification   5. Benign essential HTN   6. Typical atrial flutter ([place])   7. Essential hypertension    PLAN:    In order of problems listed above:  Afib RVR s/p cardioversion Recent admission for afib RVR and acute heart failure. Patient underwent successful TEE/DCCV and is on amiodarone and Toprol. She is in sinus bradycardia today with a heart rate of 52bpm. She is taking amiodarone 263m BID so I will decrease this down to 2031mdaily. Continue Toprol 10052maily. Continue Eliquis 5mg50mD for stroke ppx. She has a h/o aflutter s/p ablation and used to be on Multaq, so I will refer her to EP for further management.   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 Patient is euvolemic on exam. She is taking lasix 40mg12mly, BMET today. She is taking Losartan 25mg 45my and Toprol 100mg d64m. I will stop Losartan and start Entresto 24-26mg BI68mheck BMET at follow-up. Continue with GDMT at follow-up and repeat echo in  2-3 months.   Elevated troponin Coronary artery calcification HS troponin minimally elevated in the hospital. Coronary artery calcification noted on CT chest in 2023. No anginal symptoms reported. No ASA given Eliquis. Continue Toprol. Lipitor was stopped by PCP. Most recent LDL 78, may ned to consider restarting a statin in the future. For now, recommend lifestyle changes. I will order a Cardiac CTA to evaluate for ischemia.  H/o aflutter H/o aflutter s/p prior ablation in 10/2018. Refer to EP as above.   HTN BP mildly elevated. Stop losartan and start Entresto as above. Continue Toprol 100mg dai8m  Solitary kidney s/p nephrectomy BMET today.    Disposition: Follow up in 3-4 week(s) with MD/APP     Signed, Kaio Kuhlman H Tayton Decaire, Ninfa Meeker/19/2024 1:24 PM    [facility]

## 2022-08-14 ENCOUNTER — Other Ambulatory Visit
Admission: RE | Admit: 2022-08-14 | Discharge: 2022-08-14 | Disposition: A | Discharge status: Home / Self Care | Payer: Medicare Other | Attending: Medical | Admitting: Medical

## 2022-08-14 ENCOUNTER — Telehealth: Payer: Self-pay | Admitting: Internal Medicine

## 2022-08-14 ENCOUNTER — Ambulatory Visit: Payer: Medicare Other | Attending: Medical | Admitting: Medical

## 2022-08-14 ENCOUNTER — Encounter: Payer: Self-pay | Admitting: Medical

## 2022-08-14 VITALS — BP 144/82 | HR 52 | Ht 67.0 in | Wt 165.8 lb

## 2022-08-14 DIAGNOSIS — I483 Typical atrial flutter: Secondary | ICD-10-CM

## 2022-08-14 DIAGNOSIS — I2510 Atherosclerotic heart disease of native coronary artery without angina pectoris: Secondary | ICD-10-CM | POA: Diagnosis not present

## 2022-08-14 DIAGNOSIS — R7989 Other specified abnormal findings of blood chemistry: Secondary | ICD-10-CM | POA: Insufficient documentation

## 2022-08-14 DIAGNOSIS — I200 Unstable angina: Secondary | ICD-10-CM | POA: Insufficient documentation

## 2022-08-14 DIAGNOSIS — I10 Essential (primary) hypertension: Secondary | ICD-10-CM

## 2022-08-14 DIAGNOSIS — I480 Paroxysmal atrial fibrillation: Secondary | ICD-10-CM | POA: Insufficient documentation

## 2022-08-14 DIAGNOSIS — I2584 Coronary atherosclerosis due to calcified coronary lesion: Secondary | ICD-10-CM

## 2022-08-14 DIAGNOSIS — I5022 Chronic systolic (congestive) heart failure: Secondary | ICD-10-CM

## 2022-08-14 DIAGNOSIS — I509 Heart failure, unspecified: Secondary | ICD-10-CM | POA: Diagnosis not present

## 2022-08-14 LAB — BASIC METABOLIC PANEL
Anion gap: 10 (ref 5–15)
BUN: 21 mg/dL (ref 8–23)
CO2: 29 mmol/L (ref 22–32)
Calcium: 9.4 mg/dL (ref 8.9–10.3)
Chloride: 95 mmol/L — ABNORMAL LOW (ref 98–111)
Creatinine, Ser: 1.14 mg/dL — ABNORMAL HIGH (ref 0.44–1.00)
GFR, Estimated: 53 mL/min — ABNORMAL LOW (ref >60)
Glucose, Bld: 97 mg/dL (ref 70–99)
Potassium: 4.5 mmol/L (ref 3.5–5.1)
Sodium: 134 mmol/L — ABNORMAL LOW (ref 135–145)

## 2022-08-14 MED ORDER — SACUBITRIL-VALSARTAN 24-26 MG PO TABS: 1.0000 | ORAL_TABLET | Freq: Two times a day (BID) | ORAL | 1 refills | Status: DC

## 2022-08-14 MED ORDER — AMIODARONE HCL 200 MG PO TABS: 200.0000 mg | ORAL_TABLET | Freq: Every day | ORAL | 1 refills | Status: DC

## 2022-08-14 NOTE — Patient Instructions (Addendum)
Medication Instructions:   Your physician has recommended you make the following change in your medication  DECREASE Amiodarone 200 mg tablet by mouth daily.  STOP Losartan.  START Entresto 24-26 mg tablet by mouth twice daily.  *If you need a refill on your cardiac medications before your next appointment, please call your pharmacy*   Lab Work:  Your physician recommends that you have lab work: Today at [facility]  BMP  If you have labs (blood work) drawn today and your tests are completely normal, you will receive your results only by: [address] (if you have MyChart) OR A paper copy in the mail If you have any lab test that is abnormal or we need to change your treatment, we will call you to review the results.   Testing/Procedures:    Your cardiac CT will be scheduled at one of the below locations on September 07, 2022 @ 10:15 am.  [facility] [place], [place] [address] [phone]   If scheduled at [facility] , please arrive 15 mins early for check-in and test prep.   Please follow these instructions carefully (unless otherwise directed):   On the Night Before the Test: Be sure to Drink plenty of water. Do not consume any caffeinated/decaffeinated beverages or chocolate 12 hours prior to your test. Do not take any antihistamines 12 hours prior to your test.   On the Day of the Test: Drink plenty of water until 1 hour prior to the test. Do not eat any food 1 hour prior to test. You may take your regular medications prior to the test.  Take metoprolol (Lopressor) two hours prior to test. If you take Furosemide, please HOLD on the morning of the test. FEMALES- please wear underwire-free bra if available, avoid dresses & tight clothing  After the Test: Drink plenty of water. After receiving IV contrast, you may experience a mild flushed feeling. This is  normal. On occasion, you may experience a mild rash up to 24 hours after the test. This is not dangerous. If this occurs, you can take Benadryl 25 mg and increase your fluid intake. If you experience trouble breathing, this can be serious. If it is severe call 911 IMMEDIATELY. If it is mild, please call our office.    Follow-Up: At [facility], you and your health needs are our priority.  As part of our continuing mission to provide you with exceptional heart care, we have created designated Provider Care Teams.  These Care Teams include your primary Cardiologist (physician) and Advanced Practice Providers (APPs -  Physician Assistants and Nurse Practitioners) who all work together to provide you with the care you need, when you need it.  We recommend signing up for the patient portal called "MyChart".  Sign up information is provided on this After Visit Summary.  MyChart is used to connect with patients for Virtual Visits (Telemedicine).  Patients are able to view lab/test results, encounter notes, upcoming appointments, etc.  Non-urgent messages can be sent to your provider as well.   To learn more about what you can do with MyChart, go to [email or web address].    Your next appointment:   2- 3 week(s)  Provider:   Tarri Glenn, PA-C

## 2022-08-14 NOTE — Telephone Encounter (Signed)
Patient has some questions/concerns bout her medication. Please advise

## 2022-08-14 NOTE — Telephone Encounter (Signed)
Left message to call back  

## 2022-08-15 ENCOUNTER — Telehealth: Payer: Self-pay

## 2022-08-15 DIAGNOSIS — I509 Heart failure, unspecified: Secondary | ICD-10-CM

## 2022-08-15 MED ORDER — FUROSEMIDE 40 MG PO TABS: 20.0000 mg | ORAL_TABLET | Freq: Every day | ORAL | 0 refills | Status: DC

## 2022-08-15 NOTE — Telephone Encounter (Signed)
-----   Message from [place], PA-C sent at 08/15/2022  1:46 PM EST ----- Showed mild dehydration.  Decrease Lasix to 20 mg daily.  BMET in 2 weeks

## 2022-08-15 NOTE — Telephone Encounter (Signed)
-----   Message from [place], PA-C sent at 08/15/2022  1:46 PM EST ----- Showed mild dehydration.  Decrease Lasix to 20 mg daily.  BMET in 2 weeks

## 2022-08-15 NOTE — Telephone Encounter (Signed)
I called and spoke with the patient.  She states she had questions about her medication yesterday, but has figured this out. She did not think she had atorvastatin refilled, but was contacted by the pharmacy this was ready for pick up.  She advised she was clear on stopping losartan and understood additional medication changes from her visit.  She did mention that he follow up appointment with [facility] for 2-3 weeks could not be made yesterday due to no openings.  The patient's Cardiac CT has been scheduled for 09/07/22.  I have offered her a follow up with Dr. Caryl Comes on Tuesday 09/12/22 at 11:20 am. The patient voices understanding and is agreeable.

## 2022-08-31 ENCOUNTER — Telehealth: Payer: Self-pay | Admitting: Internal Medicine

## 2022-08-31 NOTE — Telephone Encounter (Signed)
Patient states she has a routine dental appointment next week, she is on Eliquis and wants to know if there is anything she needs to do prior to that appointment.

## 2022-09-01 NOTE — Telephone Encounter (Signed)
Reviewed pharmacy recommendations with patient and she verbalized understanding with no further questions at this time.

## 2022-09-01 NOTE — Telephone Encounter (Signed)
No she does not need to do anything. Continue Eliquis as normal.

## 2022-09-06 ENCOUNTER — Other Ambulatory Visit
Admission: RE | Admit: 2022-09-06 | Discharge: 2022-09-06 | Disposition: A | Discharge status: Home / Self Care | Payer: Medicare Other | Source: Ambulatory Visit | Attending: Medical | Admitting: Medical

## 2022-09-06 ENCOUNTER — Telehealth (HOSPITAL_COMMUNITY): Payer: Self-pay | Admitting: *Deleted

## 2022-09-06 DIAGNOSIS — I509 Heart failure, unspecified: Secondary | ICD-10-CM | POA: Diagnosis not present

## 2022-09-06 LAB — BASIC METABOLIC PANEL
Anion gap: 9 (ref 5–15)
BUN: 24 mg/dL — ABNORMAL HIGH (ref 8–23)
CO2: 26 mmol/L (ref 22–32)
Calcium: 9.3 mg/dL (ref 8.9–10.3)
Chloride: 96 mmol/L — ABNORMAL LOW (ref 98–111)
Creatinine, Ser: 1.09 mg/dL — ABNORMAL HIGH (ref 0.44–1.00)
GFR, Estimated: 56 mL/min — ABNORMAL LOW (ref >60)
Glucose, Bld: 106 mg/dL — ABNORMAL HIGH (ref 70–99)
Potassium: 4.7 mmol/L (ref 3.5–5.1)
Sodium: 131 mmol/L — ABNORMAL LOW (ref 135–145)

## 2022-09-06 NOTE — Telephone Encounter (Signed)
Patient returning call about her upcoming cardiac imaging study; pt verbalizes understanding of appt date/time, parking situation and where to check in, pre-test NPO status, and verified current allergies; name and call back number provided for further questions should they arise  Tricia Clement RN Navigator Cardiac Imaging [facility] [phone] office [phone] cell  Patient to take her daily medications.

## 2022-09-06 NOTE — Telephone Encounter (Signed)
Attempted to call patient regarding upcoming cardiac CT appointment. °Left message on voicemail with name and callback number ° °Dario Yono RN Navigator Cardiac Imaging °[facility] °[phone] Office °[phone] Cell ° °

## 2022-09-07 ENCOUNTER — Ambulatory Visit
Admission: RE | Admit: 2022-09-07 | Discharge: 2022-09-07 | Disposition: A | Discharge status: Home / Self Care | Payer: Medicare Other | Source: Ambulatory Visit | Attending: Medical | Admitting: Medical

## 2022-09-07 ENCOUNTER — Other Ambulatory Visit: Payer: Self-pay | Admitting: Medical

## 2022-09-07 DIAGNOSIS — I2510 Atherosclerotic heart disease of native coronary artery without angina pectoris: Secondary | ICD-10-CM

## 2022-09-07 DIAGNOSIS — R931 Abnormal findings on diagnostic imaging of heart and coronary circulation: Secondary | ICD-10-CM

## 2022-09-07 DIAGNOSIS — I700 Atherosclerosis of aorta: Secondary | ICD-10-CM | POA: Insufficient documentation

## 2022-09-07 DIAGNOSIS — R7989 Other specified abnormal findings of blood chemistry: Secondary | ICD-10-CM | POA: Insufficient documentation

## 2022-09-07 MED ORDER — IOHEXOL 350 MG/ML SOLN
75.0000 mL | Freq: Once | INTRAVENOUS | Status: AC | PRN
  Administered 2022-09-07: 75 mL via INTRAVENOUS

## 2022-09-07 MED ORDER — NITROGLYCERIN 0.4 MG SL SUBL
0.8000 mg | SUBLINGUAL_TABLET | Freq: Once | SUBLINGUAL | Status: AC
  Administered 2022-09-07: 0.8 mg via SUBLINGUAL

## 2022-09-07 NOTE — Progress Notes (Signed)
Pt tolerated procedure well. Pt denies any issues. ABC in tact. Pt provided with water and encouraged to drink plenty throughout the day. Pt ambulatory with steady gait.

## 2022-09-11 DIAGNOSIS — I493 Ventricular premature depolarization: Secondary | ICD-10-CM | POA: Insufficient documentation

## 2022-09-12 ENCOUNTER — Encounter: Payer: Self-pay | Admitting: Internal Medicine

## 2022-09-12 ENCOUNTER — Other Ambulatory Visit: Payer: Self-pay

## 2022-09-12 ENCOUNTER — Ambulatory Visit: Payer: Medicare Other | Attending: Internal Medicine | Admitting: Internal Medicine

## 2022-09-12 VITALS — BP 140/84 | HR 57 | Ht 67.0 in | Wt 167.0 lb

## 2022-09-12 DIAGNOSIS — I493 Ventricular premature depolarization: Secondary | ICD-10-CM | POA: Diagnosis not present

## 2022-09-12 DIAGNOSIS — Z79899 Other long term (current) drug therapy: Secondary | ICD-10-CM

## 2022-09-12 DIAGNOSIS — I480 Paroxysmal atrial fibrillation: Secondary | ICD-10-CM | POA: Diagnosis not present

## 2022-09-12 MED ORDER — APIXABAN 5 MG PO TABS: 5.0000 mg | ORAL_TABLET | Freq: Two times a day (BID) | ORAL | 11 refills | Status: DC

## 2022-09-12 MED ORDER — SPIRONOLACTONE 25 MG PO TABS: 25.0000 mg | ORAL_TABLET | Freq: Every day | ORAL | 3 refills | Status: DC

## 2022-09-12 MED ORDER — ATORVASTATIN CALCIUM 40 MG PO TABS: 40.0000 mg | ORAL_TABLET | Freq: Every day | ORAL | 3 refills | Status: DC

## 2022-09-12 MED ORDER — METOPROLOL SUCCINATE ER 100 MG PO TB24: 100.0000 mg | ORAL_TABLET | Freq: Every day | ORAL | 3 refills | Status: DC

## 2022-09-12 NOTE — Progress Notes (Addendum)
Patient Care Team: Kirk Ruths, MD as PCP - General (Internal Medicine) Deboraha Sprang, MD as PCP - Cardiology (Cardiology)   HPI  Tricia Alvarez is a 67 y.o. female seen in follow-up for atrial fibrillation treated in the past with dronaderone and then catheter ablation.  Holter monitor dated 3/21 demonstrated no interval atrial fibrillation; her Eliquis was discontinued and aspirin was recommended.  I saw her 1/22 and recommended that she discontinue her aspirin.  1/24 developed atrial fibrillation with a rapid rate.  Was hospitalized.  Underwent TEE guided cardioversion reanticoagulated with apixaban and started on amiodarone.  Because of depressed left ventricular function was submitted to CTA.  She received a call from the office regarding her need for catheterization because of high-grade LAD disease.  FFR is low.  Quite consternated in the interim.  The patient denies chest pain, shortness of breath, nocturnal dyspnea, orthopnea or peripheral edema.  There have been no palpitations, lightheadedness or syncope.   Not smoking since August..   DATE TEST EF   4/20 Echo (CE)  60% LAE mod  1/24 Echo   30-35 % RVe mod  BAE mod3/14  3/24 CTA   % No obstructive CAD               Date Cr K Hgb  4/20   13.1  3/24 1.09 4.7 11.6 (1/24)           Records and Results Reviewed   Past Medical History:  Diagnosis Date   Arthritis    Atrial flutter ([place])    a. 07/2018 - occurred on cruise ship. Flutter waves seen after adenosine->DCCV on ship; b. 10/2018 s/p RFCA.   Cancer ([place])    skin and vulvular   History of cardiac radiofrequency ablation    Hypothyroidism    Osteopenia    Osteoporosis    PAF (paroxysmal atrial fibrillation) ([place])    a. prev on dronedarone; b. 08/2019 Holter: No afib-->eliquis d/c'd.   Renal disorder    right nephrectomy    Past Surgical History:  Procedure Laterality Date   cardiac radiofrequency ablation     CARDIOVERSION N/A 07/18/2022    Procedure: CARDIOVERSION;  Surgeon: Minna Merritts, MD;  Location: [facility] ORS;  Service: Cardiovascular;  Laterality: N/A;   CARPAL TUNNEL RELEASE Bilateral    CESAREAN SECTION     COLONOSCOPY WITH PROPOFOL N/A 05/25/2021   Procedure: COLONOSCOPY WITH PROPOFOL;  Surgeon: Toledo, Benay Pike, MD;  Location: [facility] ENDOSCOPY;  Service: Gastroenterology;  Laterality: N/A;   TEE WITHOUT CARDIOVERSION N/A 07/18/2022   Procedure: TRANSESOPHAGEAL ECHOCARDIOGRAM (TEE);  Surgeon: Minna Merritts, MD;  Location: [facility] ORS;  Service: Cardiovascular;  Laterality: N/A;    Current Meds  Medication Sig   amiodarone (PACERONE) 200 MG tablet Take 1 tablet (200 mg total) by mouth daily.   apixaban (ELIQUIS) 5 MG TABS tablet Take 1 tablet (5 mg total) by mouth 2 (two) times daily.   Ascorbic Acid (VITAMIN C) 1000 MG tablet Take 2,000 mg by mouth daily.   atorvastatin (LIPITOR) 40 MG tablet Take 1 tablet (40 mg total) by mouth daily.   calcium carbonate (OSCAL) 1500 (600 Ca) MG TABS tablet Take 1 tablet by mouth daily.   Cholecalciferol (VITAMIN D) 50 MCG (2000 UT) CAPS Take 2,000 Units by mouth daily.   furosemide (LASIX) 40 MG tablet Take 0.5 tablets (20 mg total) by mouth daily.   levothyroxine (SYNTHROID) 75 MCG tablet Take 75 mcg  by mouth daily before breakfast.   magnesium gluconate (MAGONATE) 500 MG tablet Take 500 mg by mouth daily.   metoprolol succinate (TOPROL-XL) 100 MG 24 hr tablet Take 1 tablet (100 mg total) by mouth daily. Take with or immediately following a meal.   Multiple Vitamin (MULTIVITAMIN) tablet Take 1 tablet by mouth daily.   sacubitril-valsartan (ENTRESTO) 24-26 MG Take 1 tablet by mouth 2 (two) times daily.    Allergies  Allergen Reactions   Morphine And Related Nausea And Vomiting    Nausea  vomiting  headache      Review of Systems negative except from HPI and PMH  Physical Exam BP (!) 140/84   Pulse (!) 57   Ht 5\' 7"  (1.702 m)   Wt 167 lb (75.8 kg)   SpO2 99%    BMI 26.16 kg/m  ,Well developed and well nourished in no acute distress HENT normal Neck supple with JVP-flat Clear Regular rate and rhythm, no  gallop No murmur Abd-soft with active BS No Clubbing cyanosis  edema Skin-warm and dry A & Oriented  Grossly normal sensory and motor function  ECG sinus rhythm at 57      Estimated Creatinine Clearance: 54.7 mL/min (A) (by C-G formula based on SCr of 1.09 mg/dL (H)).   Assessment and  Plan Atrial fibrillation status post ablation now recurrent   Elevated blood pressure  CT discrepant readings  Cardiomyopathy-new ischemic/nonischemic  PVC  Hyponatremia  Anemia   Cigarette use     The patient has had recurrent atrial fibrillation following ablation.  She has been restored to sinus rhythm and will refer her to Dr. Daune Perch for consideration of repeat ablation.  In the interim, we will reassess left ventricular function by echocardiogram in about a month.  Will continue her on amiodarone for now I will check her surveillance laboratories in 2 weeks.  Laboratories were also notable for an elevated BUN/CR ratio.  Will discontinue furosemide and in the setting of her left ventricular dysfunction we will begin spironolactone at 25 mg daily (given her elevated blood pressure) and check a metabolic profile with her amiodarone labs and a repeat CBC in 2 weeks  She was noted to be anemic in the hospital.  Will recheck this.  There is discrepancy on the reads from her CTA, the first recommending catheterization because of severe LAD calcification the second showing no problems with the LAD and RCA but no comments were made about the circumflex.  I reached out to Dr. Waldron Session for his guidance on interpreting these data  She has stopped smoking  HOORAY   Current medicines are reviewed at length with the patient today .  The patient does not  have concerns regarding medicines.

## 2022-09-12 NOTE — Telephone Encounter (Signed)
Prescription refill request for Eliquis received. Indication:Afib  Last office visit:09/12/22 Caryl Comes)  Scr: 1.09 (09/06/22)  Age: 67 Weight: 75.8kg  Appropriate dose. Refill sent.

## 2022-09-12 NOTE — Patient Instructions (Addendum)
Medication Instructions:  - Your physician has recommended you make the following change in your medication:   1) STOP Furosemide (Lasix)   2) START Spironolactone 25 mg: - take 1 tablet by mouth once daily   Labwork: - Your physician recommends that you return for lab work in: 2 weeks (the week of 09/25/22)  CMET/ CBC/ TSH  [facility] at [facility] 1st desk on the right to check in (REGISTRATION)  Lab hours: Monday- Friday (7:30 am- 5:30 pm)   Testing/Procedures: 1) Echocardiogram (in 4 weeks) - Your physician has requested that you have an echocardiogram. Echocardiography is a painless test that uses sound waves to create images of your heart. It provides your doctor with information about the size and shape of your heart and how well your heart's chambers and valves are working. This procedure takes approximately one hour. There are no restrictions for this procedure. There is a possibility that an IV may need to be started during your test to inject an image enhancing agent. This is done to obtain more optimal pictures of your heart. Therefore we ask that you do at least drink some water prior to coming in to hydrate your veins.   Please do NOT wear cologne, perfume, aftershave, or lotions (deodorant is allowed). Please arrive 15 minutes prior to your appointment time.   Follow-Up: 1) You have been referred to: Dr. Lars Mage (in 8 weeks) - for Consideration of Atrial Fibrillation Ablation  2) 1 year with Dr. Caryl Comes  Any Other Special Instructions Will Be Listed Below (If Applicable).  If you need a refill on your cardiac medications before your next appointment, please call your pharmacy.   Important Information About Sugar

## 2022-09-13 ENCOUNTER — Telehealth: Payer: Self-pay | Admitting: Internal Medicine

## 2022-09-13 MED ORDER — AMIODARONE HCL 200 MG PO TABS: 200.0000 mg | ORAL_TABLET | Freq: Every day | ORAL | 1 refills | Status: DC

## 2022-09-13 NOTE — Telephone Encounter (Signed)
Pt c/o medication issue:  1. Name of Medication: amiodarone (PACERONE) 200 MG tablet   2. How are you currently taking this medication (dosage and times per day)?   Take 1 tablet (5 mg total) by mouth 2 (two) times daily.     3. Are you having a reaction (difficulty breathing--STAT)?   4. What is your medication issue? Patient called wanting to speak to [facility] about this medication.  She states yesterday when she left the office she was told to take 1 tablet once a day.  When she picked it up the instructions said to take 1 tablet 2x a day.    She also wants to know if Nira Conn has heard about her CTA discrepancy.

## 2022-09-13 NOTE — Telephone Encounter (Signed)
I spoke with the patient. She called regarding 2 separate issues:  1) Was Dr. Caryl Comes able to speak with Dr. Garen Lah regarding clarification about the discrepancy between her Cardiac CTA & FFR.  - I advised her I had not heard any updates at this time, but have set myself a reminder to follow up with Dr. Caryl Comes about this.  - The patient is aware that I will call her back tomorrow at the latest with an update about the Cardiac CT readings    2) She picked up an amiodarone RX from her pharmacy yesterday that stated: Amiodarone 200 mg: - take 1 tablet by mouth twice daily x 7 days, then take 1 tablet by mouth once daily  I advised the patient that in reviewing her records she should be on amiodarone 200 mg once daily, which is what she has been taking since a week after discharge.   - I also advised her that the last RX that was sent to the pharmacy was for: Furth, Cadence H, PA-C Furth, Cadence H, PA-C   Outpatient Medication Detail   Disp Refills Start End   amiodarone (PACERONE) 200 MG tablet 60 tablet 1 08/14/2022    Sig - Route: Take 1 tablet (200 mg total) by mouth daily. - Oral   Sent to pharmacy as: amiodarone (PACERONE) 200 MG tablet   E-Prescribing Status: Receipt confirmed by pharmacy (08/14/2022 12:19 PM EST)     I have advised the patient that the pharmacy must have filled her most recent RX under the initial orders and not the most recent orders. She is aware to continue amiodarone 200 mg once daily &  that I will update her pharmacy again of the correct RX and sig and ask that all previous instructions for this medication be discontinued.   The patient voices understanding of the above and is agreeable.

## 2022-09-14 NOTE — Telephone Encounter (Signed)
Per Secure Chat received from Dr. Caryl Comes:  Tricia Alvarez, I spoke to [facility]. He looked at the studies, and he thinks that the initial interpretation was an overcall and that the better result is the FFR and the secondary processing using a new AI algorithm supported the FFR so I would tell her she does not need a cath and that we should keep her on aspirin and statin thanks    I called and spoke with the patient regarding Dr. Olin Pia recommendations to  "Keep her on aspirin and statin," however, the patient confirmed with me that she is not currently on ASA. She is on Atorvastatin and Eliquis.  She is aware I will reach out to Dr. Caryl Comes and confirm regarding the ASA.  She is also still very concerned about the discrepancy between her Cardiac CT & FFR. She would like to know why this is and if the report can be amended to confirm she is low risk.  I have advised the patient that I will need to reach out to Dr. Caryl Comes and Dr. Garen Lah who was the reading physician to advise further.

## 2022-09-15 NOTE — Telephone Encounter (Signed)
Tricia Sprang, MD [phone])  Sent: Fri September 15, 2022 10:32 AM  To: Emily Filbert, RN; Kate Sable, MD         Message  Spoke with patient and reviewed with her the reinterpretation facilitated by Levada Dy,  specifically the value of the higher sensitivity for severe stenosis afforded by FFR over the initial images. She has obtained the disc and has sent to former colleagues who worked in radiology-- we await their input    She has requested that I reach back out to [facility] regarding an addendum to the pre FFR interpretation-- if he thinks this is appropriate.    I will reach out to him    For now will continue Apixaban  and statin for "secondary prevention" for CAD.

## 2022-09-21 ENCOUNTER — Telehealth: Payer: Self-pay | Admitting: Internal Medicine

## 2022-09-21 DIAGNOSIS — Z79899 Other long term (current) drug therapy: Secondary | ICD-10-CM

## 2022-09-21 DIAGNOSIS — I2510 Atherosclerotic heart disease of native coronary artery without angina pectoris: Secondary | ICD-10-CM

## 2022-09-21 DIAGNOSIS — I429 Cardiomyopathy, unspecified: Secondary | ICD-10-CM

## 2022-09-21 DIAGNOSIS — I5031 Acute diastolic (congestive) heart failure: Secondary | ICD-10-CM

## 2022-09-21 DIAGNOSIS — I10 Essential (primary) hypertension: Secondary | ICD-10-CM

## 2022-09-21 NOTE — Telephone Encounter (Signed)
  Pt is calling about her CTA and would like to speak with RN Nira Conn

## 2022-09-21 NOTE — Telephone Encounter (Signed)
Patient calling back in regarding her CTA that was done back in March showing severe stenosis and elevated calcium score. She is aware that imagine was reviewed again with normal findings. Dr. Caryl Comes also called and spoke with her about these results as well.   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 She reports that radiologist agreed with the initial results noted with that first report. She would like Dr. Caryl Comes to reconsider report recommendations. Advised that I will send this over to providers for their review and any recommendations.

## 2022-09-23 NOTE — Telephone Encounter (Signed)
Called on Saturday and had to leave VM

## 2022-09-25 ENCOUNTER — Other Ambulatory Visit
Admission: RE | Admit: 2022-09-25 | Discharge: 2022-09-25 | Disposition: A | Discharge status: Home / Self Care | Payer: Medicare Other | Source: Ambulatory Visit | Attending: Internal Medicine | Admitting: Internal Medicine

## 2022-09-25 DIAGNOSIS — Z79899 Other long term (current) drug therapy: Secondary | ICD-10-CM

## 2022-09-25 DIAGNOSIS — I480 Paroxysmal atrial fibrillation: Secondary | ICD-10-CM | POA: Diagnosis not present

## 2022-09-25 LAB — COMPREHENSIVE METABOLIC PANEL
ALT: 30 U/L (ref 0–44)
AST: 37 U/L (ref 15–41)
Albumin: 4.4 g/dL (ref 3.5–5.0)
Alkaline Phosphatase: 57 U/L (ref 38–126)
Anion gap: 8 (ref 5–15)
BUN: 15 mg/dL (ref 8–23)
CO2: 26 mmol/L (ref 22–32)
Calcium: 9.6 mg/dL (ref 8.9–10.3)
Chloride: 95 mmol/L — ABNORMAL LOW (ref 98–111)
Creatinine, Ser: 1.13 mg/dL — ABNORMAL HIGH (ref 0.44–1.00)
GFR, Estimated: 54 mL/min — ABNORMAL LOW (ref >60)
Glucose, Bld: 99 mg/dL (ref 70–99)
Potassium: 4.6 mmol/L (ref 3.5–5.1)
Sodium: 129 mmol/L — ABNORMAL LOW (ref 135–145)
Total Bilirubin: 1.1 mg/dL (ref 0.3–1.2)
Total Protein: 7.6 g/dL (ref 6.5–8.1)

## 2022-09-25 LAB — CBC
HCT: 40.5 % (ref 36.0–46.0)
Hemoglobin: 13.6 g/dL (ref 12.0–15.0)
MCH: 30 pg (ref 26.0–34.0)
MCHC: 33.6 g/dL (ref 30.0–36.0)
MCV: 89.4 fL (ref 80.0–100.0)
Platelets: 261 10*3/uL (ref 150–400)
RBC: 4.53 MIL/uL (ref 3.87–5.11)
RDW: 15.5 % (ref 11.5–15.5)
WBC: 5.9 10*3/uL (ref 4.0–10.5)
nRBC: 0 % (ref 0.0–0.2)

## 2022-09-25 LAB — TSH: TSH: 3.321 u[IU]/mL (ref 0.350–4.500)

## 2022-09-25 MED ORDER — FUROSEMIDE 20 MG PO TABS: 20.0000 mg | ORAL_TABLET | ORAL | 3 refills | Status: DC

## 2022-09-27 NOTE — Telephone Encounter (Signed)
Called and spoke with the patient;  appreciate dr Waldron Session revised report  Have reviewed also with Dr Loney Laurence and the senstitivy of the FFR is sufficiently high, that with it being normal will continue with secondary prevention, Apixaban  and stating.  Her former colleagues also reviewed the disc and felt the initial "severe" stenosis is only about 60 % a number consonant with the review of Dr Loney Laurence

## 2022-09-27 NOTE — Telephone Encounter (Signed)
Patient is following up, requesting to speak with with Dr. Olin Pia nurse regarding adjusting Lasix.

## 2022-09-28 ENCOUNTER — Telehealth: Payer: Self-pay | Admitting: Internal Medicine

## 2022-09-28 NOTE — Telephone Encounter (Signed)
Spoke with pt who states she is not taking Furosemide.  She is only taking Spironolactone 25mg  - 1 tablet by mouth daily as her diuretic. Pt advised will forward to make Dr Caryl Comes aware.  Pt verbalizes understanding and agrees with current plan.

## 2022-09-28 NOTE — Telephone Encounter (Signed)
Attempted phone call to pt and left voicemail message to contact RN at [phone]. 

## 2022-09-28 NOTE — Telephone Encounter (Signed)
Pt returning nurses call regarding medication. Please advise 

## 2022-09-29 MED ORDER — SPIRONOLACTONE 25 MG PO TABS: 12.5000 mg | ORAL_TABLET | Freq: Every day | ORAL | 3 refills | Status: DC

## 2022-09-29 NOTE — Addendum Note (Signed)
Addended by: Alois Cliche on: 09/29/2022 01:13 PM   Modules accepted: Orders

## 2022-09-29 NOTE — Telephone Encounter (Signed)
Per Dr Graciela Husbands have her cut her spironolactone in half to see if that is the cause of her hyponatremia. If this does not improve she will need to follow-up with her primary care doctor to consider SIADH

## 2022-09-29 NOTE — Telephone Encounter (Signed)
Spoke with pt and advised of recommendation to take Spironolactone 25mg - 1/2 tablet by mouth and plan to repeat labs as scheduled. If Na+ remains low pt will need to follow up with PCP. Pt verbalizes understanding and thanked RN for the call.  

## 2022-09-29 NOTE — Telephone Encounter (Signed)
Spoke with pt and advised of recommendation to take Spironolactone 25mg  - 1/2 tablet by mouth and plan to repeat labs as scheduled. If Na+ remains low pt will need to follow up with PCP. Pt verbalizes understanding and thanked Charity fundraiser for the call.

## 2022-10-04 ENCOUNTER — Telehealth: Payer: Self-pay | Admitting: Internal Medicine

## 2022-10-04 MED ORDER — SACUBITRIL-VALSARTAN 24-26 MG PO TABS: 1.0000 | ORAL_TABLET | Freq: Two times a day (BID) | ORAL | 1 refills | Status: DC

## 2022-10-04 NOTE — Telephone Encounter (Signed)
*  STAT* If patient is at the pharmacy, call can be transferred to refill team.   1. Which medications need to be refilled? (please list name of each medication and dose if known)   sacubitril-valsartan (ENTRESTO) 24-26 MG    2. Which pharmacy/location (including street and city if local pharmacy) is medication to be sent to?   [facility] - [place], [place] - [address]    3. Do they need a 30 day or 90 day supply? 30

## 2022-10-04 NOTE — Telephone Encounter (Signed)
Requested Prescriptions   Signed Prescriptions Disp Refills   sacubitril-valsartan (ENTRESTO) 24-26 MG 60 tablet 1    Sig: Take 1 tablet by mouth 2 (two) times daily.    Authorizing Provider: Marianne Sofia    Ordering User: Thayer Headings, Devion Chriscoe L

## 2022-10-16 ENCOUNTER — Other Ambulatory Visit
Admission: RE | Admit: 2022-10-16 | Discharge: 2022-10-16 | Disposition: A | Discharge status: Home / Self Care | Payer: Medicare Other | Attending: Internal Medicine | Admitting: Internal Medicine

## 2022-10-16 DIAGNOSIS — I10 Essential (primary) hypertension: Secondary | ICD-10-CM | POA: Diagnosis not present

## 2022-10-16 DIAGNOSIS — I2510 Atherosclerotic heart disease of native coronary artery without angina pectoris: Secondary | ICD-10-CM

## 2022-10-16 DIAGNOSIS — I5031 Acute diastolic (congestive) heart failure: Secondary | ICD-10-CM

## 2022-10-16 DIAGNOSIS — I2584 Coronary atherosclerosis due to calcified coronary lesion: Secondary | ICD-10-CM | POA: Insufficient documentation

## 2022-10-16 DIAGNOSIS — Z79899 Other long term (current) drug therapy: Secondary | ICD-10-CM | POA: Diagnosis not present

## 2022-10-16 DIAGNOSIS — I429 Cardiomyopathy, unspecified: Secondary | ICD-10-CM

## 2022-10-16 LAB — BASIC METABOLIC PANEL
Anion gap: 11 (ref 5–15)
BUN: 20 mg/dL (ref 8–23)
CO2: 24 mmol/L (ref 22–32)
Calcium: 9.4 mg/dL (ref 8.9–10.3)
Chloride: 99 mmol/L (ref 98–111)
Creatinine, Ser: 0.98 mg/dL (ref 0.44–1.00)
GFR, Estimated: >60 mL/min (ref >60)
Glucose, Bld: 91 mg/dL (ref 70–99)
Potassium: 4.7 mmol/L (ref 3.5–5.1)
Sodium: 134 mmol/L — ABNORMAL LOW (ref 135–145)

## 2022-10-16 LAB — LDL CHOLESTEROL, DIRECT: Direct LDL: 20 mg/dL (ref 0–99)

## 2022-10-18 ENCOUNTER — Ambulatory Visit: Payer: Medicare Other | Attending: Internal Medicine

## 2022-10-18 DIAGNOSIS — I480 Paroxysmal atrial fibrillation: Secondary | ICD-10-CM | POA: Diagnosis not present

## 2022-10-18 LAB — ECHOCARDIOGRAM COMPLETE
AR max vel: 3.43 cm2
AV Area VTI: 3.36 cm2
AV Area mean vel: 3.3 cm2
AV Mean grad: 3 mmHg
AV Peak grad: 4.7 mmHg
Ao pk vel: 1.08 m/s
Area-P 1/2: 2.76 cm2
Calc EF: 65.5 %
S' Lateral: 3.7 cm
Single Plane A2C EF: 66.3 %
Single Plane A4C EF: 62.5 %

## 2022-10-23 DIAGNOSIS — E039 Hypothyroidism, unspecified: Secondary | ICD-10-CM | POA: Diagnosis not present

## 2022-10-23 DIAGNOSIS — I2510 Atherosclerotic heart disease of native coronary artery without angina pectoris: Secondary | ICD-10-CM | POA: Diagnosis not present

## 2022-10-23 DIAGNOSIS — J439 Emphysema, unspecified: Secondary | ICD-10-CM | POA: Diagnosis not present

## 2022-10-23 DIAGNOSIS — Z8679 Personal history of other diseases of the circulatory system: Secondary | ICD-10-CM | POA: Diagnosis not present

## 2022-10-23 DIAGNOSIS — I4891 Unspecified atrial fibrillation: Secondary | ICD-10-CM | POA: Diagnosis not present

## 2022-11-02 ENCOUNTER — Encounter: Payer: Self-pay | Admitting: *Deleted

## 2022-11-02 DIAGNOSIS — Z006 Encounter for examination for normal comparison and control in clinical research program: Secondary | ICD-10-CM

## 2022-11-02 NOTE — Research (Signed)
Spoke with Tricia Alvarez about V1P states she is not interested at this time.

## 2022-11-14 NOTE — H&P (View-Only) (Signed)
Electrophysiology Office Note:    Date:  11/15/2022   ID:  Tricia Alvarez, DOB 11/19/1955, MRN [id number]  [facility] Cardiologist:  Steven Klein, MD  [facility] Electrophysiologist:  None   Referring MD: Klein, Steven C, MD   Chief Complaint: Atrial fibrillation  History of Present Illness:    Tricia Alvarez is a 67 y.o. female who I am seeing today for an evaluation of atrial fibrillation at the request of Dr. Klein.  Dr. Klein last saw the patient September 12, 2022.  The patient has previously been treated with dronedarone by physicians at [facility].  The patient underwent EP study with catheter ablation of atrial flutter in May 2020.  This was performed by Ali Al-Mudamgha, MD.  She was ultimately taken off of her Eliquis and started on aspirin monotherapy.  In January of this year she redeveloped atrial fibrillation with an associated reduced ejection fraction.  Eliquis was restarted and she was ultimately cardioverted after transesophageal echo.  Her EF was newly depressed and she underwent CTA. She is on amiodarone.  Today she tells me that she is taking amiodarone every other day.  She tells me that she is not short of breath at rest but becomes short of breath with any exertion.  This is occurring despite her being back in normal rhythm.  No outright chest pain that she is aware of.      Their past medical, social and family history was reveiwed.   ROS:   Please see the history of present illness.    All other systems reviewed and are negative.  EKGs/Labs/Other Studies Reviewed:    The following studies were reviewed today:  October 18, 2022 echo EF 60 to 65% RV function normal Severely dilated left atrium Mild MR Mild AI   September 15, 2022 CTA with FFR CTA showed severe calcification/stenosis in the proximal LAD.  FFR did not show significant stenosis in the LAD. Calcium score greater than 2000  October 16, 2022 creatinine 0.98  Today's EKG shows sinus  rhythm with a ventricular rate of 48 bpm.  Normal intervals.  Physical Exam:    VS:  BP 100/70 (BP Location: Left Arm, Patient Position: Sitting, Cuff Size: Normal)   Pulse (!) 50   Ht 5' 7" (1.702 m)   Wt 173 lb 6 oz (78.6 kg)   SpO2 98%   BMI 27.15 kg/m     Wt Readings from Last 3 Encounters:  11/15/22 173 lb 6 oz (78.6 kg)  09/12/22 167 lb (75.8 kg)  08/14/22 165 lb 12.8 oz (75.2 kg)     GEN:  Well nourished, well developed in no acute distress CARDIAC: RRR, no murmurs, rubs, gallops RESPIRATORY:  Clear to auscultation without rales, wheezing or rhonchi       ASSESSMENT AND PLAN:    1. Coronary artery calcification   2. PAF (paroxysmal atrial fibrillation) (HCC)    #Exertional short of breath #Coronary artery disease The patient has exertional shortness of breath and severe calcification on her coronary CT.  Given her symptoms and degree of calcium, I would like further evaluation of her coronary arteries with a left heart catheterization.  I discussed this in detail with the patient and she wishes to proceed.  If the coronary arteries showed no significant flow obstruction, we will proceed with catheter ablation of her atrial fibrillation.  If there is severe obstruction of the coronary arteries, will need to revisit treatment timeline.  #Persistent atrial fibrillation and flutter   Maintaining sinus rhythm after recent cardioversion.  On amiodarone 200 mg by mouth once daily.  On Eliquis for stroke prophylaxis.  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  Discussed treatment options today for AF including antiarrhythmic drug therapy and ablation. Discussed risks, recovery and likelihood of success with each treatment strategy. Risk, benefits, and alternatives to EP study and radiofrequency ablation for afib were discussed. These risks include but are not limited to stroke, bleeding,  vascular damage, tamponade, perforation, damage to the esophagus, lungs, and other structures, pulmonary vein stenosis, worsening renal function, and death.  Discussed potential need for repeat ablation procedures and antiarrhythmic drugs after an initial ablation. The patient understands these risk and wishes to proceed.  We will therefore proceed with catheter ablation at the next available time.  Carto, ICE, anesthesia are requested for the procedure.  Will also obtain CT PV protocol prior to the procedure to exclude LAA thrombus and further evaluate atrial anatomy.     Total time spent with patient today 50 minutes. This includes reviewing records, evaluating the patient and coordinating care.     Signed, Taylor Levick T. Wadie Liew, MD, FACC, FHRS 11/15/2022 11:09 AM    Electrophysiology  [facility] 

## 2022-11-14 NOTE — Progress Notes (Unsigned)
Electrophysiology Office Note:    Date:  11/15/2022   ID:  Tricia Alvarez, DOB 1956/03/12, MRN [id number]  [facility] Cardiologist:  Sherryl Manges, MD  [facility] Electrophysiologist:  None   Referring MD: Duke Salvia, MD   Chief Complaint: Atrial fibrillation  History of Present Illness:    Tricia Alvarez is a 67 y.o. female who I am seeing today for an evaluation of atrial fibrillation at the request of Dr. Graciela Husbands.  Dr. Graciela Husbands last saw the patient September 12, 2022.  The patient has previously been treated with dronedarone by physicians at [facility].  The patient underwent EP study with catheter ablation of atrial flutter in May 2020.  This was performed by Terrill Mohr, MD.  She was ultimately taken off of her Eliquis and started on aspirin monotherapy.  In January of this year she redeveloped atrial fibrillation with an associated reduced ejection fraction.  Eliquis was restarted and she was ultimately cardioverted after transesophageal echo.  Her EF was newly depressed and she underwent CTA. She is on amiodarone.  Today she tells me that she is taking amiodarone every other day.  She tells me that she is not short of breath at rest but becomes short of breath with any exertion.  This is occurring despite her being back in normal rhythm.  No outright chest pain that she is aware of.      Their past medical, social and family history was reveiwed.   ROS:   Please see the history of present illness.    All other systems reviewed and are negative.  EKGs/Labs/Other Studies Reviewed:    The following studies were reviewed today:  October 18, 2022 echo EF 60 to 65% RV function normal Severely dilated left atrium Mild MR Mild AI   September 15, 2022 CTA with FFR CTA showed severe calcification/stenosis in the proximal LAD.  FFR did not show significant stenosis in the LAD. Calcium score greater than 2000  October 16, 2022 creatinine 0.98  Today's EKG shows sinus  rhythm with a ventricular rate of 48 bpm.  Normal intervals.  Physical Exam:    VS:  BP 100/70 (BP Location: Left Arm, Patient Position: Sitting, Cuff Size: Normal)   Pulse (!) 50   Ht 5\' 7"  (1.702 m)   Wt 173 lb 6 oz (78.6 kg)   SpO2 98%   BMI 27.15 kg/m     Wt Readings from Last 3 Encounters:  11/15/22 173 lb 6 oz (78.6 kg)  09/12/22 167 lb (75.8 kg)  08/14/22 165 lb 12.8 oz (75.2 kg)     GEN:  Well nourished, well developed in no acute distress CARDIAC: RRR, no murmurs, rubs, gallops RESPIRATORY:  Clear to auscultation without rales, wheezing or rhonchi       ASSESSMENT AND PLAN:    1. Coronary artery calcification   2. PAF (paroxysmal atrial fibrillation) (HCC)    #Exertional short of breath #Coronary artery disease The patient has exertional shortness of breath and severe calcification on her coronary CT.  Given her symptoms and degree of calcium, I would like further evaluation of her coronary arteries with a left heart catheterization.  I discussed this in detail with the patient and she wishes to proceed.  If the coronary arteries showed no significant flow obstruction, we will proceed with catheter ablation of her atrial fibrillation.  If there is severe obstruction of the coronary arteries, will need to revisit treatment timeline.  #Persistent atrial fibrillation and flutter  Maintaining sinus rhythm after recent cardioversion.  On amiodarone 200 mg by mouth once daily.  On Eliquis for stroke prophylaxis.  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  Discussed treatment options today for AF including antiarrhythmic drug therapy and ablation. Discussed risks, recovery and likelihood of success with each treatment strategy. Risk, benefits, and alternatives to EP study and radiofrequency ablation for afib were discussed. These risks include but are not limited to stroke, bleeding,  vascular damage, tamponade, perforation, damage to the esophagus, lungs, and other structures, pulmonary vein stenosis, worsening renal function, and death.  Discussed potential need for repeat ablation procedures and antiarrhythmic drugs after an initial ablation. The patient understands these risk and wishes to proceed.  We will therefore proceed with catheter ablation at the next available time.  Carto, ICE, anesthesia are requested for the procedure.  Will also obtain CT PV protocol prior to the procedure to exclude LAA thrombus and further evaluate atrial anatomy.     Total time spent with patient today 50 minutes. This includes reviewing records, evaluating the patient and coordinating care.     Signed, Rossie Muskrat. Lalla Brothers, MD, [facility], [facility] 11/15/2022 11:09 AM    Electrophysiology [facility]

## 2022-11-15 ENCOUNTER — Encounter: Payer: Self-pay | Admitting: Cardiology

## 2022-11-15 ENCOUNTER — Ambulatory Visit: Payer: Medicare Other | Attending: Cardiology | Admitting: Cardiology

## 2022-11-15 ENCOUNTER — Other Ambulatory Visit
Admission: RE | Admit: 2022-11-15 | Discharge: 2022-11-15 | Disposition: A | Discharge status: Home / Self Care | Payer: Medicare Other | Source: Ambulatory Visit | Attending: Cardiology | Admitting: Cardiology

## 2022-11-15 VITALS — BP 100/70 | HR 48 | Ht 67.0 in | Wt 173.4 lb

## 2022-11-15 DIAGNOSIS — I2584 Coronary atherosclerosis due to calcified coronary lesion: Secondary | ICD-10-CM

## 2022-11-15 DIAGNOSIS — I480 Paroxysmal atrial fibrillation: Secondary | ICD-10-CM | POA: Insufficient documentation

## 2022-11-15 DIAGNOSIS — I2510 Atherosclerotic heart disease of native coronary artery without angina pectoris: Secondary | ICD-10-CM | POA: Insufficient documentation

## 2022-11-15 LAB — CBC
HCT: 33.7 % — ABNORMAL LOW (ref 36.0–46.0)
Hemoglobin: 11.2 g/dL — ABNORMAL LOW (ref 12.0–15.0)
MCH: 31.5 pg (ref 26.0–34.0)
MCHC: 33.2 g/dL (ref 30.0–36.0)
MCV: 94.7 fL (ref 80.0–100.0)
Platelets: 211 10*3/uL (ref 150–400)
RBC: 3.56 MIL/uL — ABNORMAL LOW (ref 3.87–5.11)
RDW: 16.1 % — ABNORMAL HIGH (ref 11.5–15.5)
WBC: 5.9 10*3/uL (ref 4.0–10.5)
nRBC: 0 % (ref 0.0–0.2)

## 2022-11-15 LAB — BASIC METABOLIC PANEL
Anion gap: 10 (ref 5–15)
BUN: 9 mg/dL (ref 8–23)
CO2: 26 mmol/L (ref 22–32)
Calcium: 9.4 mg/dL (ref 8.9–10.3)
Chloride: 95 mmol/L — ABNORMAL LOW (ref 98–111)
Creatinine, Ser: 1.18 mg/dL — ABNORMAL HIGH (ref 0.44–1.00)
GFR, Estimated: 51 mL/min — ABNORMAL LOW (ref >60)
Glucose, Bld: 99 mg/dL (ref 70–99)
Potassium: 4.6 mmol/L (ref 3.5–5.1)
Sodium: 131 mmol/L — ABNORMAL LOW (ref 135–145)

## 2022-11-15 NOTE — Patient Instructions (Signed)
Medication Instructions:  Your physician recommends that you continue on your current medications as directed. Please refer to the Current Medication list given to you today.  *If you need a refill on your cardiac medications before your next appointment, please call your pharmacy*  Lab Work: BMET and CBC today  If you have labs (blood work) drawn today and your tests are completely normal, you will receive your results only by: MyChart Message (if you have MyChart) OR A paper copy in the mail If you have any lab test that is abnormal or we need to change your treatment, we will call you to review the results.  Testing/Procedures: Your physician has requested that you have a cardiac catheterization. Cardiac catheterization is used to diagnose and/or treat various heart conditions. Doctors may recommend this procedure for a number of different reasons. The most common reason is to evaluate chest pain. Chest pain can be a symptom of coronary artery disease (CAD), and cardiac catheterization can show whether plaque is narrowing or blocking your heart's arteries. This procedure is also used to evaluate the valves, as well as measure the blood flow and oxygen levels in different parts of your heart. For further information please visit [email or web address] Please follow instruction sheet, as given.  Your physician has recommended that you have an ablation. Catheter ablation is a medical procedure used to treat some cardiac arrhythmias (irregular heartbeats). During catheter ablation, a long, thin, flexible tube is put into a blood vessel in your groin (upper thigh), or neck. This tube is called an ablation catheter. It is then guided to your heart through the blood vessel. Radio frequency waves destroy small areas of heart tissue where abnormal heartbeats may cause an arrhythmia to start. Please see the instruction sheet given to you today. You are scheduled for Atrial Fibrillation Ablation on Tuesday,  August 20 with Dr. Steffanie Dunn.Please arrive at the Main Entrance A at [facility]: [address] [place], [place] [address] at 8:30 AM    Follow-Up: At [facility], you and your health needs are our priority.  As part of our continuing mission to provide you with exceptional heart care, we have created designated Provider Care Teams.  These Care Teams include your primary Cardiologist (physician) and Advanced Practice Providers (APPs -  Physician Assistants and Nurse Practitioners) who all work together to provide you with the care you need, when you need it.  Your next appointment:   We will call you to arrange follow up.   [name] [address] [name], [address] [place] [address] Dept: [phone] Loc: [phone]  Tricia Alvarez  11/15/2022  You are scheduled for a Cardiac Catheterization on Wednesday, June 5 with Dr. Cristal Deer End.  1. Please arrive at the Heart & Vascular Center Entrance of [facility], [address], [place] [address] at 8:30 AM (This is 1 hour(s) prior to your procedure time).  Proceed to the Check-In Desk directly inside the entrance.  Procedure Parking: Use the entrance off of the [address] side of the hospital. Turn right upon entering and follow the driveway to parking that is directly in front of the Heart & Vascular Center. There is no valet parking available at this entrance, however there is an awning directly in front of the Heart & Vascular Center for drop off/ pick up for patients.  Special note: Every effort is made to have your  procedure done on time. Please understand that emergencies sometimes delay scheduled procedures.  2. Diet: Do not eat solid foods after midnight.  The patient may have clear liquids until 5am upon the day of the procedure.  3. Labs: You will need to have blood drawn TODAY at [facility]  Entrance, Go to 1st desk on your right to register.  Address: [address] [place], [place] [address]  Open: 8am - 5pm  Phone: [phone]. You do not need to be fasting.  4. Medication instructions in preparation for your procedure:   Contrast Allergy: No  Stop taking Eliquis (Apixiban) on Monday, June 3.  Stop taking, Spironolactone Wednesday, June 5,  On the morning of your procedure, take your Aspirin 81 mg and any morning medicines NOT listed above.  You may use sips of water.  5. Plan to go home the same day, you will only stay overnight if medically necessary. 6. Bring a current list of your medications and current insurance cards. 7. You MUST have a responsible person to drive you home. 8. Someone MUST be with you the first 24 hours after you arrive home or your discharge will be delayed. 9. Please wear clothes that are easy to get on and off and wear slip-on shoes.  Thank you for allowing [place] to care for you!   -- [facility]

## 2022-11-16 ENCOUNTER — Other Ambulatory Visit: Payer: Self-pay

## 2022-11-16 DIAGNOSIS — I480 Paroxysmal atrial fibrillation: Secondary | ICD-10-CM

## 2022-11-28 ENCOUNTER — Telehealth: Payer: Self-pay | Admitting: *Deleted

## 2022-11-28 NOTE — Telephone Encounter (Addendum)
Cardiac Catheterization scheduled at [facility] for: Wednesday November 29, 2022 11:30 AM Arrival time Heart [facility] Vascular Center Entrance of  [facility] at: 10:30 AM  Nothing to eat after midnight prior to procedure, clear liquids until 5 AM day of procedure.  Medication instructions: -Hold:  Eliquis-none 11/27/22 until post procedure  Spironolactone-AM of procedure  Entresto-PM prior/AM of procedure-per protocol GFR 51   -Other usual morning medications can be taken with sips of water including aspirin 81 mg.  Plan to go home the same day, you will only stay overnight if medically necessary.  Confirmed patient has responsible adult to drive home post procedure and be with patient first 24 hours after arriving home.  Left message for patient to call back to review procedure instructions.

## 2022-11-28 NOTE — Telephone Encounter (Signed)
Reviewed procedure instructions with patient.  

## 2022-11-29 ENCOUNTER — Other Ambulatory Visit: Payer: Self-pay

## 2022-11-29 ENCOUNTER — Ambulatory Visit
Admission: RE | Admit: 2022-11-29 | Discharge: 2022-11-29 | Disposition: A | Discharge status: Home / Self Care | Payer: Medicare Other | Attending: Internal Medicine | Admitting: Internal Medicine

## 2022-11-29 ENCOUNTER — Encounter: Payer: Self-pay | Admitting: Internal Medicine

## 2022-11-29 ENCOUNTER — Encounter
Admission: RE | Disposition: A | Discharge status: Home / Self Care | Payer: Self-pay | Source: Home / Self Care | Attending: Internal Medicine

## 2022-11-29 DIAGNOSIS — R0602 Shortness of breath: Secondary | ICD-10-CM | POA: Diagnosis present

## 2022-11-29 DIAGNOSIS — I4819 Other persistent atrial fibrillation: Secondary | ICD-10-CM | POA: Insufficient documentation

## 2022-11-29 DIAGNOSIS — I2510 Atherosclerotic heart disease of native coronary artery without angina pectoris: Secondary | ICD-10-CM | POA: Diagnosis not present

## 2022-11-29 DIAGNOSIS — I2584 Coronary atherosclerosis due to calcified coronary lesion: Secondary | ICD-10-CM | POA: Diagnosis not present

## 2022-11-29 DIAGNOSIS — I4892 Unspecified atrial flutter: Secondary | ICD-10-CM | POA: Insufficient documentation

## 2022-11-29 DIAGNOSIS — Z79899 Other long term (current) drug therapy: Secondary | ICD-10-CM | POA: Diagnosis not present

## 2022-11-29 DIAGNOSIS — Z7901 Long term (current) use of anticoagulants: Secondary | ICD-10-CM | POA: Diagnosis not present

## 2022-11-29 HISTORY — PX: LEFT HEART CATH AND CORONARY ANGIOGRAPHY: CATH118249

## 2022-11-29 HISTORY — PX: CORONARY PRESSURE/FFR STUDY: CATH118243

## 2022-11-29 LAB — POCT ACTIVATED CLOTTING TIME: Activated Clotting Time: 238 seconds

## 2022-11-29 SURGERY — LEFT HEART CATH AND CORONARY ANGIOGRAPHY
Anesthesia: Moderate Sedation

## 2022-11-29 MED ORDER — SODIUM CHLORIDE 0.9% FLUSH: 3.0000 mL | INTRAVENOUS | Status: DC | PRN

## 2022-11-29 MED ORDER — HYDRALAZINE HCL 20 MG/ML IJ SOLN: 10.0000 mg | INTRAMUSCULAR | Status: DC | PRN

## 2022-11-29 MED ORDER — FENTANYL CITRATE (PF) 100 MCG/2ML IJ SOLN
INTRAMUSCULAR | Status: DC | PRN
  Administered 2022-11-29 (×2): 25 ug via INTRAVENOUS

## 2022-11-29 MED ORDER — MIDAZOLAM HCL 2 MG/2ML IJ SOLN
INTRAMUSCULAR | Status: AC
  Filled 2022-11-29: qty 2

## 2022-11-29 MED ORDER — IOHEXOL 300 MG/ML  SOLN
INTRAMUSCULAR | Status: DC | PRN
  Administered 2022-11-29: 51 mL

## 2022-11-29 MED ORDER — NITROGLYCERIN 1 MG/10 ML FOR IR/CATH LAB
INTRA_ARTERIAL | Status: DC | PRN
  Administered 2022-11-29: 100 ug via INTRACORONARY

## 2022-11-29 MED ORDER — METOPROLOL SUCCINATE ER 100 MG PO TB24: 50.0000 mg | ORAL_TABLET | Freq: Every day | ORAL | 3 refills | Status: DC

## 2022-11-29 MED ORDER — SODIUM CHLORIDE 0.9 % IV SOLN: 250.0000 mL | INTRAVENOUS | Status: DC | PRN

## 2022-11-29 MED ORDER — ACETAMINOPHEN 325 MG PO TABS: 650.0000 mg | ORAL_TABLET | ORAL | Status: DC | PRN

## 2022-11-29 MED ORDER — HEPARIN SODIUM (PORCINE) 1000 UNIT/ML IJ SOLN
INTRAMUSCULAR | Status: AC
  Filled 2022-11-29: qty 10

## 2022-11-29 MED ORDER — SODIUM CHLORIDE 0.9% FLUSH: 3.0000 mL | Freq: Two times a day (BID) | INTRAVENOUS | Status: DC

## 2022-11-29 MED ORDER — MIDAZOLAM HCL 2 MG/2ML IJ SOLN
INTRAMUSCULAR | Status: DC | PRN
  Administered 2022-11-29: 2 mg via INTRAVENOUS
  Administered 2022-11-29: 1 mg via INTRAVENOUS

## 2022-11-29 MED ORDER — HEPARIN SODIUM (PORCINE) 1000 UNIT/ML IJ SOLN
INTRAMUSCULAR | Status: DC | PRN
  Administered 2022-11-29: 3000 [IU] via INTRAVENOUS
  Administered 2022-11-29: 3800 [IU] via INTRAVENOUS
  Administered 2022-11-29: 4000 [IU] via INTRAVENOUS

## 2022-11-29 MED ORDER — ASPIRIN 81 MG PO CHEW: 81.0000 mg | CHEWABLE_TABLET | ORAL | Status: DC

## 2022-11-29 MED ORDER — VERAPAMIL HCL 2.5 MG/ML IV SOLN
INTRAVENOUS | Status: AC
  Filled 2022-11-29: qty 2

## 2022-11-29 MED ORDER — HEPARIN (PORCINE) IN NACL 2000-0.9 UNIT/L-% IV SOLN
INTRAVENOUS | Status: DC | PRN
  Administered 2022-11-29: 1000 mL

## 2022-11-29 MED ORDER — ONDANSETRON HCL 4 MG/2ML IJ SOLN: 4.0000 mg | Freq: Four times a day (QID) | INTRAMUSCULAR | Status: DC | PRN

## 2022-11-29 MED ORDER — SODIUM CHLORIDE 0.9 % WEIGHT BASED INFUSION
3.0000 mL/kg/h | INTRAVENOUS | Status: AC
  Administered 2022-11-29: 3 mL/kg/h via INTRAVENOUS

## 2022-11-29 MED ORDER — VERAPAMIL HCL 2.5 MG/ML IV SOLN
INTRAVENOUS | Status: DC | PRN
  Administered 2022-11-29 (×2): 2.5 mg via INTRA_ARTERIAL

## 2022-11-29 MED ORDER — HEPARIN (PORCINE) IN NACL 1000-0.9 UT/500ML-% IV SOLN
INTRAVENOUS | Status: AC
  Filled 2022-11-29: qty 1000

## 2022-11-29 MED ORDER — SODIUM CHLORIDE 0.9 % IV SOLN: INTRAVENOUS | Status: DC

## 2022-11-29 MED ORDER — SODIUM CHLORIDE 0.9 % WEIGHT BASED INFUSION: 1.0000 mL/kg/h | INTRAVENOUS | Status: DC

## 2022-11-29 MED ORDER — FENTANYL CITRATE (PF) 100 MCG/2ML IJ SOLN
INTRAMUSCULAR | Status: AC
  Filled 2022-11-29: qty 2

## 2022-11-29 SURGICAL SUPPLY — 13 items
CATH 5FR JL3.5 JR4 ANG PIG MP (CATHETERS) IMPLANT
CATH LAUNCHER 5F EBU3.5 (CATHETERS) IMPLANT
DEVICE RAD TR BAND REGULAR (VASCULAR PRODUCTS) IMPLANT
DRAPE BRACHIAL (DRAPES) IMPLANT
GLIDESHEATH SLEND SS 6F .021 (SHEATH) IMPLANT
GUIDEWIRE INQWIRE 1.5J.035X260 (WIRE) IMPLANT
GUIDEWIRE PRESSURE X 175 (WIRE) IMPLANT
INQWIRE 1.5J .035X260CM (WIRE) ×2
KIT ENCORE 26 ADVANTAGE (KITS) IMPLANT
PACK CARDIAC CATH (CUSTOM PROCEDURE TRAY) ×2 IMPLANT
PROTECTION STATION PRESSURIZED (MISCELLANEOUS) ×2
SET ATX-X65L (MISCELLANEOUS) IMPLANT
STATION PROTECTION PRESSURIZED (MISCELLANEOUS) IMPLANT

## 2022-11-29 NOTE — Progress Notes (Signed)
Dr End in to see patient and discuss medical plan. While in room, radial site examined by provider.

## 2022-11-29 NOTE — Interval H&P Note (Signed)
History and Physical Interval Note:  11/29/2022 11:51 AM  Tricia Alvarez  has presented today for surgery, with the diagnosis of shortness of breath and coronary artery calcification.  The various methods of treatment have been discussed with the patient and family. After consideration of risks, benefits and other options for treatment, the patient has consented to  Procedure(s): LEFT HEART CATH AND CORONARY ANGIOGRAPHY (Left) as a surgical intervention.  The patient's history has been reviewed, patient examined, no change in status, stable for surgery.  I have reviewed the patient's chart and labs.  Questions were answered to the patient's satisfaction.    Cath Lab Visit (complete for each Cath Lab visit)  Clinical Evaluation Leading to the Procedure:   ACS: No.  Non-ACS:    Anginal Classification: CCS III  Anti-ischemic medical therapy: Minimal Therapy (1 class of medications)  Non-Invasive Test Results: Low-risk stress test findings: cardiac mortality <1%/year  Prior CABG: No previous CABG  Countess Biebel

## 2022-11-30 ENCOUNTER — Encounter: Payer: Self-pay | Admitting: Internal Medicine

## 2022-12-03 ENCOUNTER — Other Ambulatory Visit: Payer: Self-pay | Admitting: Medical

## 2022-12-07 ENCOUNTER — Telehealth: Payer: Self-pay | Admitting: Internal Medicine

## 2022-12-07 NOTE — Telephone Encounter (Signed)
Pt c/o medication issue:  1. Name of Medication: amiodarone (PACERONE) 200 MG tablet   metoprolol succinate (TOPROL-XL) 100 MG 24 hr tablet   spironolactone (ALDACTONE) 25 MG tablet   2. How are you currently taking this medication (dosage and times per day)?    3. Are you having a reaction (difficulty breathing--STAT)? no  4. What is your medication issue? Patient states that the dosage for these medication is suppose to be written differently. She states that the MG is suppose to be half, of what's there now for the medication. Please advise

## 2022-12-07 NOTE — Telephone Encounter (Signed)
Spoke with pt who reports several medication changes as below.  She reports Amiodarone dose was changed by her PCP office, Metoprolol 100mg  was decreased to every other day.  These changes have not been noted in her MAR.  Confirmed Spironolactone dose is documented as correct in pt's chart. Pt advised to bring all of her medications with her to her 12/18/2022 OV appointment with Sherie Don.  Pt also advised to bring any after visit summary notes or discharge summary notes with her from these previous appointments to help clarify medications.  Pt verbalizes understanding and thanked Charity fundraiser for the callback.

## 2022-12-11 NOTE — Progress Notes (Signed)
Cardiology Office Note Date:  12/18/2022  Patient ID:  Tricia, Alvarez 12/01/1955, MRN [id number] PCP:  Lauro Regulus, MD  Cardiologist:  None Electrophysiologist: Sherryl Manges, MD & Steffanie Dunn, MD    Chief Complaint: Afib, SOB follow-up  History of Present Illness: Tricia Alvarez is a 67 y.o. female with PMH notable for non-obs CAD, paroxysmal A-fib; seen today for Sherryl Manges, MD for routine electrophysiology followup.  She is status post atrial flutter ablation 10/2020, now with recurrence of atrial fibrillation. She saw Dr. Lalla Brothers 10/2022 for further discussion of her A-fib management.   During visit with Dr. Lalla Brothers, they discussed A-fib ablation and that has been scheduled for Aug 2024.  During the visit, she was complaining of shortness of breath with exertion despite being in normal sinus rhythm.  Left heart cath recommended for further evaluation, that did not show significant arterial narrowing.  Today, her main complaint is a lack of energy. She also feels dizzy/lightheaded with activity. Has tried to go for walks to slowly increase stamina, but starts to feel dizzy and then gets anxious that she will syncopize. She has not syncopized. She is also getting lightheaded with showering.  She has a BP cuff at home, but does not use it.   Since cardioversion in January, she has had 2-3 palpitation episodes. They have all been brief, lasting a few minutes.   Taking eliquis BID, no bleeding concerns.  Her LHC site on R wrist has completely healed.    she denies chest pain, palpitations, PND, orthopnea, nausea, vomiting, syncope, edema, weight gain, or early satiety.    AAD History: Amiodarone   Past Medical History:  Diagnosis Date   Arthritis    Atrial flutter (HCC)    a. 07/2018 - occurred on cruise ship. Flutter waves seen after adenosine->DCCV on ship; b. 10/2018 s/p RFCA.   Cancer (HCC)    skin and vulvular   History of cardiac radiofrequency ablation     Hypothyroidism    Osteopenia    Osteoporosis    PAF (paroxysmal atrial fibrillation) (HCC)    a. prev on dronedarone; b. 08/2019 Holter: No afib-->eliquis d/c'd.   Renal disorder    right nephrectomy    Past Surgical History:  Procedure Laterality Date   cardiac radiofrequency ablation     CARDIOVERSION N/A 07/18/2022   Procedure: CARDIOVERSION;  Surgeon: Antonieta Iba, MD;  Location: [facility] ORS;  Service: Cardiovascular;  Laterality: N/A;   CARPAL TUNNEL RELEASE Bilateral    CESAREAN SECTION     COLONOSCOPY WITH PROPOFOL N/A 05/25/2021   Procedure: COLONOSCOPY WITH PROPOFOL;  Surgeon: Toledo, Boykin Nearing, MD;  Location: [facility] ENDOSCOPY;  Service: Gastroenterology;  Laterality: N/A;   CORONARY PRESSURE/FFR STUDY N/A 11/29/2022   Procedure: CORONARY PRESSURE/FFR STUDY;  Surgeon: Yvonne Kendall, MD;  Location: [facility] INVASIVE CV LAB;  Service: Cardiovascular;  Laterality: N/A;   LEFT HEART CATH AND CORONARY ANGIOGRAPHY Left 11/29/2022   Procedure: LEFT HEART CATH AND CORONARY ANGIOGRAPHY;  Surgeon: Yvonne Kendall, MD;  Location: [facility] INVASIVE CV LAB;  Service: Cardiovascular;  Laterality: Left;   TEE WITHOUT CARDIOVERSION N/A 07/18/2022   Procedure: TRANSESOPHAGEAL ECHOCARDIOGRAM (TEE);  Surgeon: Antonieta Iba, MD;  Location: [facility] ORS;  Service: Cardiovascular;  Laterality: N/A;    Current Outpatient Medications  Medication Instructions   amiodarone (PACERONE) 200 mg, Oral, Daily   apixaban (ELIQUIS) 5 mg, Oral, 2 times daily   atorvastatin (LIPITOR) 40 mg, Oral, Daily   calcium carbonate (OSCAL) 1500 (  600 Ca) MG TABS tablet 1 tablet, Oral, Daily   ENTRESTO 24-26 MG 1 tablet, Oral, 2 times daily   levothyroxine (SYNTHROID) 75 mcg, Oral, Daily before breakfast   magnesium gluconate (MAGONATE) 500 mg, Oral, Daily   metoprolol succinate (TOPROL XL) 25 mg, Oral, Daily   Multiple Vitamin (MULTIVITAMIN) tablet 1 tablet, Oral, Daily   vitamin C 2,000 mg, Oral, Daily   Vitamin D 2,000  Units, Oral, Daily    Social History:  The patient  reports that she quit smoking about 9 months ago. Her smoking use included cigarettes. She has never used smokeless tobacco. She reports current alcohol use. She reports that she does not use drugs.    ROS:  Please see the history of present illness. All other systems are reviewed and otherwise negative.   PHYSICAL EXAM:  VS:  BP 104/76 (BP Location: Left Arm, Patient Position: Sitting, Cuff Size: Normal)   Pulse (!) 57   Ht 5\' 7"  (1.702 m)   Wt 166 lb (75.3 kg)   SpO2 98%   BMI 26.00 kg/m  BMI: Body mass index is 26 kg/m.  GEN- The patient is well appearing, alert and oriented x 3 today.   Lungs- Clear to ausculation bilaterally, normal work of breathing.  Heart- Regular, bradycardic rate and rhythm, no murmurs, rubs or gallops Extremities- No peripheral edema, warm, dry   EKG is ordered. Personal review of EKG from today shows:  SB, rate 57bpm; RBBB   Recent Labs: 07/14/2022: B Natriuretic Peptide 708.8; Magnesium 1.4 09/25/2022: ALT 30; TSH 3.321 11/15/2022: BUN 9; Creatinine, Ser 1.18; Hemoglobin 11.2; Platelets 211; Potassium 4.6; Sodium 131  10/16/2022: Direct LDL 20   CrCl cannot be calculated (Patient's most recent lab result is older than the maximum 21 days allowed.).   Wt Readings from Last 3 Encounters:  12/18/22 166 lb (75.3 kg)  11/29/22 168 lb (76.2 kg)  11/15/22 173 lb 6 oz (78.6 kg)     Additional studies reviewed include: Previous EP, cardiology notes.   LHC, 11/29/2022 Moderate single-vessel coronary artery disease with 50 to 60% proximal/mid LAD stenosis with heavy calcification that is not hemodynamically significant (RFR = 0.93).  Minimal luminal irregularities noted in the RCA without obstructive disease. Normal left ventricular systolic function and filling pressure.  TTE, 10/18/2022 1. Left ventricular ejection fraction, by estimation, is 60 to 65%. Left ventricular ejection fraction by 2D MOD  biplane is 65.5 %. The left ventricle has normal function. The left ventricle has no regional wall motion abnormalities. Left ventricular diastolic parameters were normal. The average left ventricular global longitudinal strain is -20.8 %. The global longitudinal strain is normal.   2. Right ventricular systolic function is normal. The right ventricular size is mildly enlarged.   3. Left atrial size was severely dilated.   4. The mitral valve is normal in structure. Mild mitral valve regurgitation.   5. The aortic valve is tricuspid. Aortic valve regurgitation is mild. Mild aortic valve stenosis.   6. Aortic dilatation noted. There is mild dilatation of the ascending aorta, measuring 39 mm.   7. The inferior vena cava is normal in size with greater than 50% respiratory variability, suggesting right atrial pressure of 3 mmHg.   TEE, 07/18/2022  1. Left ventricular ejection fraction, by estimation, is 30 to 35%. The left ventricle has moderately decreased function. The left ventricle demonstrates global hypokinesis.   2. Right ventricular systolic function is mildly reduced. The right ventricular size is normal.   3.  Left atrial size was moderately dilated. No left atrial/left atrial appendage thrombus was detected.   4. The mitral valve is normal in structure. Mild mitral valve regurgitation. No evidence of mitral stenosis.   5. The aortic valve is normal in structure. Aortic valve regurgitation is mild. No aortic stenosis is present.   6. The inferior vena cava is normal in size with greater than 50% respiratory variability, suggesting right atrial pressure of 3 mmHg.   7. Agitated saline contrast bubble study was negative, with no evidence of any interatrial shunt.   Conclusion(s)/Recommendation(s): Normal biventricular function without evidence of hemodynamically significant valvular heart disease.   ASSESSMENT AND PLAN:  #) parox AFib Significant improvement in symptomatic episodes   Scheduled for repeat AF ablation in August  Bradycardic with decreased energy Decrease toprol 25mg  daily Continue amiodarone 200mg  daily Update TSH, T4 today  #) hypercoag d/t AFib CHA2DS2-VASc Score = 5 [CHF History: 1, HTN History: 1, Diabetes History: 0, Stroke History: 0, Vascular Disease History: 1, Age Score: 1, Gender Score: 1].  Therefore, the patient's annual risk of stroke is 7.2 %. NOAC - 5mg  eliquis BID, appropriately dosed No bleeding concerns     #) HFimpEF Recent echo showed improved LVEF Patient hypotensive with dizziness and lightheadedness at home Euvolemic on exam Stop spiro Continue entresto 24/26 Check BP daily for next week and send readings via mychart Consider stopping entresto if remains hypotensive   Current medicines are reviewed at length with the patient today.   The patient has concerns regarding her medicines.  The following changes were made today:   DECREASE toprol to 25mg  daily STOP spironolactone  Labs/ tests ordered today include:  Orders Placed This Encounter  Procedures   T4, free   TSH   EKG 12-Lead     Disposition: Follow up with EP APP in as usual post procedure   Signed, Sherie Don, NP  12/18/22  11:20 AM  Electrophysiology [facility]

## 2022-12-18 ENCOUNTER — Ambulatory Visit: Payer: Medicare Other | Attending: Cardiology | Admitting: Cardiology

## 2022-12-18 ENCOUNTER — Encounter: Payer: Self-pay | Admitting: Cardiology

## 2022-12-18 ENCOUNTER — Other Ambulatory Visit
Admission: RE | Admit: 2022-12-18 | Discharge: 2022-12-18 | Disposition: A | Discharge status: Home / Self Care | Payer: Medicare Other | Attending: Cardiology | Admitting: Cardiology

## 2022-12-18 VITALS — BP 104/76 | HR 57 | Ht 67.0 in | Wt 166.0 lb

## 2022-12-18 DIAGNOSIS — D6869 Other thrombophilia: Secondary | ICD-10-CM

## 2022-12-18 DIAGNOSIS — I480 Paroxysmal atrial fibrillation: Secondary | ICD-10-CM | POA: Insufficient documentation

## 2022-12-18 DIAGNOSIS — I5032 Chronic diastolic (congestive) heart failure: Secondary | ICD-10-CM

## 2022-12-18 DIAGNOSIS — Z79899 Other long term (current) drug therapy: Secondary | ICD-10-CM | POA: Insufficient documentation

## 2022-12-18 LAB — T4, FREE: Free T4: 1.58 ng/dL — ABNORMAL HIGH (ref 0.61–1.12)

## 2022-12-18 LAB — TSH: TSH: 2.718 u[IU]/mL (ref 0.350–4.500)

## 2022-12-18 MED ORDER — METOPROLOL SUCCINATE ER 25 MG PO TB24: 25.0000 mg | ORAL_TABLET | Freq: Every day | ORAL | 0 refills | Status: DC

## 2022-12-18 NOTE — Patient Instructions (Addendum)
Medication Instructions:  STOP Spironolactone DECREASE metoprolol to 25 mg by mouth daily  *If you need a refill on your cardiac medications before your next appointment, please call your pharmacy*   Lab Work: TSH and Free T4 - Please go to the [facility]. You will check in at the front desk to the right as you walk into the atrium. Valet Parking is offered if needed. - No appointment needed. You may go any day between 7 am and 6 pm.  If you have labs (blood work) drawn today and your tests are completely normal, you will receive your results only by: MyChart Message (if you have MyChart) OR A paper copy in the mail If you have any lab test that is abnormal or we need to change your treatment, we will call you to review the results.   Testing/Procedures: No testing ordered   Follow-Up: At [facility], you and your health needs are our priority.  As part of our continuing mission to provide you with exceptional heart care, we have created designated Provider Care Teams.  These Care Teams include your primary Cardiologist (physician) and Advanced Practice Providers (APPs -  Physician Assistants and Nurse Practitioners) who all work together to provide you with the care you need, when you need it.  We recommend signing up for the patient portal called "MyChart".  Sign up information is provided on this After Visit Summary.  MyChart is used to connect with patients for Virtual Visits (Telemedicine).  Patients are able to view lab/test results, encounter notes, upcoming appointments, etc.  Non-urgent messages can be sent to your provider as well.   To learn more about what you can do with MyChart, go to [email or web address].    Your next appointment:   To be determined after your procedure   Provider:   Sherryl Manges, MD or Sherie Don, NP   Other Instructions Please check your blood pressure once a day and call or send [place] your readings in one week.

## 2022-12-26 ENCOUNTER — Encounter: Payer: Self-pay | Admitting: Cardiology

## 2023-01-01 ENCOUNTER — Other Ambulatory Visit: Payer: Self-pay | Admitting: Medical

## 2023-01-30 ENCOUNTER — Other Ambulatory Visit
Admission: RE | Admit: 2023-01-30 | Discharge: 2023-01-30 | Disposition: A | Discharge status: Home / Self Care | Payer: Medicare Other | Attending: Cardiology | Admitting: Cardiology

## 2023-01-30 DIAGNOSIS — I480 Paroxysmal atrial fibrillation: Secondary | ICD-10-CM | POA: Diagnosis present

## 2023-01-30 LAB — BASIC METABOLIC PANEL
Anion gap: 11 (ref 5–15)
BUN: 14 mg/dL (ref 8–23)
CO2: 26 mmol/L (ref 22–32)
Calcium: 10.3 mg/dL (ref 8.9–10.3)
Chloride: 94 mmol/L — ABNORMAL LOW (ref 98–111)
Creatinine, Ser: 1.19 mg/dL — ABNORMAL HIGH (ref 0.44–1.00)
GFR, Estimated: 50 mL/min — ABNORMAL LOW (ref >60)
Glucose, Bld: 100 mg/dL — ABNORMAL HIGH (ref 70–99)
Potassium: 4.4 mmol/L (ref 3.5–5.1)
Sodium: 131 mmol/L — ABNORMAL LOW (ref 135–145)

## 2023-01-30 LAB — CBC WITH DIFFERENTIAL/PLATELET
Abs Immature Granulocytes: 0.03 10*3/uL (ref 0.00–0.07)
Basophils Absolute: 0.1 10*3/uL (ref 0.0–0.1)
Basophils Relative: 1 %
Eosinophils Absolute: 0.2 10*3/uL (ref 0.0–0.5)
Eosinophils Relative: 3 %
HCT: 37.9 % (ref 36.0–46.0)
Hemoglobin: 12.7 g/dL (ref 12.0–15.0)
Immature Granulocytes: 1 %
Lymphocytes Relative: 27 %
Lymphs Abs: 1.4 10*3/uL (ref 0.7–4.0)
MCH: 30.6 pg (ref 26.0–34.0)
MCHC: 33.5 g/dL (ref 30.0–36.0)
MCV: 91.3 fL (ref 80.0–100.0)
Monocytes Absolute: 0.9 10*3/uL (ref 0.1–1.0)
Monocytes Relative: 17 %
Neutro Abs: 2.8 10*3/uL (ref 1.7–7.7)
Neutrophils Relative %: 51 %
Platelets: 276 10*3/uL (ref 150–400)
RBC: 4.15 MIL/uL (ref 3.87–5.11)
RDW: 13.1 % (ref 11.5–15.5)
WBC: 5.3 10*3/uL (ref 4.0–10.5)
nRBC: 0 % (ref 0.0–0.2)

## 2023-02-01 ENCOUNTER — Other Ambulatory Visit: Payer: Self-pay | Admitting: Internal Medicine

## 2023-02-01 DIAGNOSIS — Z1231 Encounter for screening mammogram for malignant neoplasm of breast: Secondary | ICD-10-CM

## 2023-02-03 ENCOUNTER — Other Ambulatory Visit: Payer: Self-pay | Admitting: Medical

## 2023-02-07 ENCOUNTER — Ambulatory Visit
Admission: RE | Admit: 2023-02-07 | Discharge: 2023-02-07 | Disposition: A | Discharge status: Home / Self Care | Payer: Medicare Other | Source: Ambulatory Visit | Attending: Cardiology | Admitting: Cardiology

## 2023-02-07 DIAGNOSIS — I480 Paroxysmal atrial fibrillation: Secondary | ICD-10-CM | POA: Insufficient documentation

## 2023-02-07 DIAGNOSIS — I2510 Atherosclerotic heart disease of native coronary artery without angina pectoris: Secondary | ICD-10-CM | POA: Diagnosis not present

## 2023-02-07 DIAGNOSIS — I2584 Coronary atherosclerosis due to calcified coronary lesion: Secondary | ICD-10-CM | POA: Insufficient documentation

## 2023-02-07 MED ORDER — SODIUM CHLORIDE 0.9 % IV BOLUS
150.0000 mL | Freq: Once | INTRAVENOUS | Status: AC
  Administered 2023-02-07: 150 mL via INTRAVENOUS

## 2023-02-07 MED ORDER — IOHEXOL 350 MG/ML SOLN
75.0000 mL | Freq: Once | INTRAVENOUS | Status: AC | PRN
  Administered 2023-02-07: 75 mL via INTRAVENOUS

## 2023-02-08 ENCOUNTER — Telehealth: Payer: Self-pay | Admitting: Internal Medicine

## 2023-02-08 ENCOUNTER — Encounter: Payer: Self-pay | Admitting: Internal Medicine

## 2023-02-08 NOTE — Telephone Encounter (Signed)
Pt c/o medication issue:  1. Name of Medication:   amiodarone (PACERONE) 200 MG tablet    2. How are you currently taking this medication (dosage and times per day)?   Take 1 tablet (200 mg total) by mouth daily.    3. Are you having a reaction (difficulty breathing--STAT)? No  4. What is your medication issue? Pt states that per her eye doctor the above medication is causing her to have "deposits" in her eye. Pt says that eye doctor states that this is a side effect from medication. Please advise   Pt c/o medication issue:  1. Name of Medication: apixaban (ELIQUIS) 5 MG TABS tablet   sacubitril-valsartan (ENTRESTO) 24-26 MG   2. How are you currently taking this medication (dosage and times per day)?   3. Are you having a reaction (difficulty breathing--STAT)? No  4. What is your medication issue? Pt states that medications have become too expensive. She would like to know if there is something else that can be prescribed or is she able to apply for Pt. Assistance. Please advise

## 2023-02-09 NOTE — Telephone Encounter (Signed)
I responded to the patient directly Perhaps yu can followup with her and make sure she is ok with everything  Thanks SK

## 2023-02-12 NOTE — Pre-Procedure Instructions (Signed)
Instructed patient on the following items: Arrival time 0815 Nothing to eat or drink after midnight No meds AM of procedure Responsible person to drive you home and stay with you for 24 hrs  Have you missed any doses of anti-coagulant Eliquis- Takes twice a day, Hasn't missed any doses.  Don't take dose in the morning.

## 2023-02-12 NOTE — Anesthesia Preprocedure Evaluation (Signed)
Anesthesia Evaluation  Patient identified by MRN, date of birth, ID band Patient awake    Reviewed: Allergy & Precautions, NPO status , Patient's Chart, lab work & pertinent test results  History of Anesthesia Complications Negative for: history of anesthetic complications  Airway Mallampati: III  TM Distance: >3 FB Neck ROM: Full    Dental  (+) Dental Advisory Given,    Pulmonary neg shortness of breath, neg sleep apnea, neg COPD, neg recent URI, former smoker   Pulmonary exam normal breath sounds clear to auscultation       Cardiovascular hypertension (metoprolol, Entresto), Pt. on home beta blockers (-) angina + CAD (non-obstructive) and +CHF (EF 60-65%)  (-) Past MI, (-) Cardiac Stents and (-) CABG + dysrhythmias (RBBB) Atrial Fibrillation + Valvular Problems/Murmurs (mild) AI and MR  Rhythm:Regular Rate:Normal  LHC 11/29/2022: Conclusions: 1. Moderate single-vessel coronary artery disease with 50 to 60% proximal/mid LAD stenosis with heavy calcification that is not hemodynamically significant (RFR = 0.93).  Minimal luminal irregularities noted in the RCA without obstructive disease. 2. Normal left ventricular systolic function and filling pressure.  TTE 10/18/2022: IMPRESSIONS    1. Left ventricular ejection fraction, by estimation, is 60 to 65%. Left  ventricular ejection fraction by 2D MOD biplane is 65.5 %. The left  ventricle has normal function. The left ventricle has no regional wall  motion abnormalities. Left ventricular  diastolic parameters were normal. The average left ventricular global  longitudinal strain is -20.8 %. The global longitudinal strain is normal.   2. Right ventricular systolic function is normal. The right ventricular  size is mildly enlarged.   3. Left atrial size was severely dilated.   4. The mitral valve is normal in structure. Mild mitral valve  regurgitation.   5. The aortic valve is  tricuspid. Aortic valve regurgitation is mild.  Mild aortic valve stenosis.   6. Aortic dilatation noted. There is mild dilatation of the ascending  aorta, measuring 39 mm.   7. The inferior vena cava is normal in size with greater than 50%  respiratory variability, suggesting right atrial pressure of 3 mmHg.      Neuro/Psych  Headaches, neg Seizures    GI/Hepatic negative GI ROS, Neg liver ROS,,,  Endo/Other  neg diabetesHypothyroidism    Renal/GU Renal disease (s/p right nephrectomy)     Musculoskeletal  (+) Arthritis ,  osteoporosis   Abdominal   Peds  Hematology negative hematology ROS (+) Lab Results      Component                Value               Date                      WBC                      5.3                 01/30/2023                HGB                      12.7                01/30/2023                HCT  37.9                01/30/2023                MCV                      91.3                01/30/2023                PLT                      276                 01/30/2023              Anesthesia Other Findings Last Eliquis: last night  Reproductive/Obstetrics                             Anesthesia Physical Anesthesia Plan  ASA: 3  Anesthesia Plan: General   Post-op Pain Management: Tylenol PO (pre-op)*   Induction: Intravenous  PONV Risk Score and Plan: 3 and Ondansetron, Dexamethasone and Treatment may vary due to age or medical condition  Airway Management Planned: Oral ETT  Additional Equipment:   Intra-op Plan:   Post-operative Plan: Extubation in OR  Informed Consent: I have reviewed the patients History and Physical, chart, labs and discussed the procedure including the risks, benefits and alternatives for the proposed anesthesia with the patient or authorized representative who has indicated his/her understanding and acceptance.     Dental advisory given  Plan Discussed with:  Anesthesiologist and CRNA  Anesthesia Plan Comments: (Risks of general anesthesia discussed including, but not limited to, sore throat, hoarse voice, chipped/damaged teeth, injury to vocal cords, nausea and vomiting, allergic reactions, lung infection, heart attack, stroke, and death. All questions answered. )       Anesthesia Quick Evaluation

## 2023-02-13 ENCOUNTER — Ambulatory Visit (HOSPITAL_BASED_OUTPATIENT_CLINIC_OR_DEPARTMENT_OTHER): Payer: Medicare Other | Admitting: Anesthesiology

## 2023-02-13 ENCOUNTER — Encounter (HOSPITAL_COMMUNITY)
Admission: RE | Disposition: A | Discharge status: Home / Self Care | Payer: Self-pay | Source: Home / Self Care | Attending: Cardiology

## 2023-02-13 ENCOUNTER — Other Ambulatory Visit (HOSPITAL_COMMUNITY): Payer: Self-pay

## 2023-02-13 ENCOUNTER — Other Ambulatory Visit: Payer: Self-pay

## 2023-02-13 ENCOUNTER — Ambulatory Visit (HOSPITAL_COMMUNITY): Payer: Self-pay | Admitting: Anesthesiology

## 2023-02-13 ENCOUNTER — Ambulatory Visit (HOSPITAL_COMMUNITY)
Admission: RE | Admit: 2023-02-13 | Discharge: 2023-02-13 | Disposition: A | Discharge status: Home / Self Care | Payer: Medicare Other | Attending: Cardiology | Admitting: Cardiology

## 2023-02-13 DIAGNOSIS — Z79899 Other long term (current) drug therapy: Secondary | ICD-10-CM | POA: Diagnosis not present

## 2023-02-13 DIAGNOSIS — I4819 Other persistent atrial fibrillation: Secondary | ICD-10-CM | POA: Insufficient documentation

## 2023-02-13 DIAGNOSIS — I480 Paroxysmal atrial fibrillation: Secondary | ICD-10-CM | POA: Diagnosis not present

## 2023-02-13 DIAGNOSIS — Z87891 Personal history of nicotine dependence: Secondary | ICD-10-CM | POA: Diagnosis not present

## 2023-02-13 DIAGNOSIS — I5031 Acute diastolic (congestive) heart failure: Secondary | ICD-10-CM

## 2023-02-13 DIAGNOSIS — I2510 Atherosclerotic heart disease of native coronary artery without angina pectoris: Secondary | ICD-10-CM | POA: Insufficient documentation

## 2023-02-13 DIAGNOSIS — I4892 Unspecified atrial flutter: Secondary | ICD-10-CM | POA: Diagnosis not present

## 2023-02-13 DIAGNOSIS — I110 Hypertensive heart disease with heart failure: Secondary | ICD-10-CM

## 2023-02-13 DIAGNOSIS — Z7901 Long term (current) use of anticoagulants: Secondary | ICD-10-CM | POA: Insufficient documentation

## 2023-02-13 HISTORY — PX: ATRIAL FIBRILLATION ABLATION: EP1191

## 2023-02-13 SURGERY — ATRIAL FIBRILLATION ABLATION
Anesthesia: General

## 2023-02-13 MED ORDER — HEPARIN (PORCINE) IN NACL 1000-0.9 UT/500ML-% IV SOLN
INTRAVENOUS | Status: DC | PRN
  Administered 2023-02-13 (×3): 500 mL

## 2023-02-13 MED ORDER — SODIUM CHLORIDE 0.9% FLUSH: 3.0000 mL | Freq: Two times a day (BID) | INTRAVENOUS | Status: DC

## 2023-02-13 MED ORDER — HEPARIN SODIUM (PORCINE) 1000 UNIT/ML IJ SOLN
INTRAMUSCULAR | Status: DC | PRN
  Administered 2023-02-13: 11000 [IU] via INTRAVENOUS
  Administered 2023-02-13: 2000 [IU] via INTRAVENOUS

## 2023-02-13 MED ORDER — APIXABAN 5 MG PO TABS
5.0000 mg | ORAL_TABLET | Freq: Two times a day (BID) | ORAL | Status: DC
  Administered 2023-02-13: 5 mg via ORAL
  Filled 2023-02-13 (×2): qty 1

## 2023-02-13 MED ORDER — ONDANSETRON HCL 4 MG/2ML IJ SOLN: 4.0000 mg | Freq: Four times a day (QID) | INTRAMUSCULAR | Status: DC | PRN

## 2023-02-13 MED ORDER — HEPARIN SODIUM (PORCINE) 1000 UNIT/ML IJ SOLN
INTRAMUSCULAR | Status: DC | PRN
  Administered 2023-02-13: 1000 [IU] via INTRAVENOUS

## 2023-02-13 MED ORDER — SODIUM CHLORIDE 0.9 % IV SOLN: 250.0000 mL | INTRAVENOUS | Status: DC | PRN

## 2023-02-13 MED ORDER — LIDOCAINE 2% (20 MG/ML) 5 ML SYRINGE
INTRAMUSCULAR | Status: DC | PRN
  Administered 2023-02-13: 80 mg via INTRAVENOUS

## 2023-02-13 MED ORDER — MIDAZOLAM HCL 5 MG/5ML IJ SOLN
INTRAMUSCULAR | Status: DC | PRN
  Administered 2023-02-13: 2 mg via INTRAVENOUS

## 2023-02-13 MED ORDER — PANTOPRAZOLE SODIUM 40 MG PO TBEC
40.0000 mg | DELAYED_RELEASE_TABLET | Freq: Every day | ORAL | 0 refills | Status: DC
Start: 2023-02-13 — End: 2023-05-16
  Filled 2023-02-13: qty 45, 45d supply, fill #0

## 2023-02-13 MED ORDER — MIDAZOLAM HCL 5 MG/5ML IJ SOLN
INTRAMUSCULAR | Status: AC
  Filled 2023-02-13: qty 5

## 2023-02-13 MED ORDER — PHENYLEPHRINE HCL-NACL 20-0.9 MG/250ML-% IV SOLN
INTRAVENOUS | Status: DC | PRN
  Administered 2023-02-13: 25 ug/min via INTRAVENOUS

## 2023-02-13 MED ORDER — SODIUM CHLORIDE 0.9 % IV SOLN: INTRAVENOUS | Status: DC

## 2023-02-13 MED ORDER — PANTOPRAZOLE SODIUM 40 MG PO TBEC
40.0000 mg | DELAYED_RELEASE_TABLET | Freq: Every day | ORAL | Status: DC
  Administered 2023-02-13: 40 mg via ORAL
  Filled 2023-02-13 (×2): qty 1

## 2023-02-13 MED ORDER — ROCURONIUM BROMIDE 10 MG/ML (PF) SYRINGE
PREFILLED_SYRINGE | INTRAVENOUS | Status: DC | PRN
  Administered 2023-02-13: 60 mg via INTRAVENOUS

## 2023-02-13 MED ORDER — ACETAMINOPHEN 325 MG PO TABS: 650.0000 mg | ORAL_TABLET | ORAL | Status: DC | PRN

## 2023-02-13 MED ORDER — SODIUM CHLORIDE 0.9% FLUSH: 3.0000 mL | INTRAVENOUS | Status: DC | PRN

## 2023-02-13 MED ORDER — DEXAMETHASONE SODIUM PHOSPHATE 10 MG/ML IJ SOLN
INTRAMUSCULAR | Status: DC | PRN
  Administered 2023-02-13: 5 mg via INTRAVENOUS

## 2023-02-13 MED ORDER — ACETAMINOPHEN 500 MG PO TABS
1000.0000 mg | ORAL_TABLET | Freq: Once | ORAL | Status: AC
  Administered 2023-02-13: 1000 mg via ORAL
  Filled 2023-02-13: qty 2

## 2023-02-13 MED ORDER — SUGAMMADEX SODIUM 200 MG/2ML IV SOLN
INTRAVENOUS | Status: DC | PRN
  Administered 2023-02-13: 150 mg via INTRAVENOUS

## 2023-02-13 MED ORDER — ONDANSETRON HCL 4 MG/2ML IJ SOLN
INTRAMUSCULAR | Status: DC | PRN
  Administered 2023-02-13: 4 mg via INTRAVENOUS

## 2023-02-13 MED ORDER — EPHEDRINE SULFATE-NACL 50-0.9 MG/10ML-% IV SOSY
PREFILLED_SYRINGE | INTRAVENOUS | Status: DC | PRN
Start: 2023-02-13 — End: 2023-02-13
  Administered 2023-02-13: 5 mg via INTRAVENOUS

## 2023-02-13 MED ORDER — PROTAMINE SULFATE 10 MG/ML IV SOLN
INTRAVENOUS | Status: DC | PRN
  Administered 2023-02-13: 15 mg via INTRAVENOUS
  Administered 2023-02-13 (×2): 10 mg via INTRAVENOUS

## 2023-02-13 MED ORDER — PROPOFOL 10 MG/ML IV BOLUS
INTRAVENOUS | Status: DC | PRN
Start: 2023-02-13 — End: 2023-02-13
  Administered 2023-02-13: 150 mg via INTRAVENOUS

## 2023-02-13 MED ORDER — COLCHICINE 0.6 MG PO TABS
0.6000 mg | ORAL_TABLET | Freq: Two times a day (BID) | ORAL | Status: DC
  Administered 2023-02-13: 0.6 mg via ORAL
  Filled 2023-02-13 (×2): qty 1

## 2023-02-13 MED ORDER — COLCHICINE 0.6 MG PO TABS
0.6000 mg | ORAL_TABLET | Freq: Every day | ORAL | 0 refills | Status: DC
Start: 2023-02-13 — End: 2023-03-14
  Filled 2023-02-13: qty 5, 5d supply, fill #0

## 2023-02-13 MED ORDER — HEPARIN SODIUM (PORCINE) 1000 UNIT/ML IJ SOLN
INTRAMUSCULAR | Status: AC
  Filled 2023-02-13: qty 10

## 2023-02-13 SURGICAL SUPPLY — 16 items
CATH ABLAT QDOT MICRO BI TC DF (CATHETERS) IMPLANT
CATH OCTARAY 2.0 F 3-3-3-3-3 (CATHETERS) IMPLANT
CATH S-M CIRCA TEMP PROBE (CATHETERS) IMPLANT
CATH SOUNDSTAR ECO 8FR (CATHETERS) IMPLANT
CATH WEBSTER BI DIR CS D-F CRV (CATHETERS) IMPLANT
CLOSURE PERCLOSE PROSTYLE (VASCULAR PRODUCTS) IMPLANT
COVER SWIFTLINK CONNECTOR (BAG) ×1 IMPLANT
PACK EP LATEX FREE (CUSTOM PROCEDURE TRAY) ×1
PACK EP LF (CUSTOM PROCEDURE TRAY) ×1 IMPLANT
PAD DEFIB RADIO PHYSIO CONN (PAD) ×1 IMPLANT
PATCH CARTO3 (PAD) IMPLANT
SHEATH BAYLIS TRANSSEPTAL 98CM (NEEDLE) IMPLANT
SHEATH CARTO VIZIGO SM CVD (SHEATH) IMPLANT
SHEATH PINNACLE 8F 10CM (SHEATH) IMPLANT
SHEATH PINNACLE 9F 10CM (SHEATH) IMPLANT
TUBING SMART ABLATE COOLFLOW (TUBING) IMPLANT

## 2023-02-13 NOTE — H&P (Signed)
Electrophysiology Office Note:     Date:  02/13/2023    ID:  Tricia Alvarez, DOB Jun 04, 1956, MRN [id number]   [facility] Cardiologist:  Sherryl Manges, MD  [facility] Electrophysiologist:  None    Referring MD: Duke Salvia, MD    Chief Complaint: Atrial fibrillation   History of Present Illness:     Tricia Alvarez is a 67 y.o. female who I am seeing today for an evaluation of atrial fibrillation at the request of Dr. Graciela Husbands.  Dr. Graciela Husbands last saw the patient September 12, 2022.  The patient has previously been treated with dronedarone by physicians at [facility].  The patient underwent EP study with catheter ablation of atrial flutter in May 2020.  This was performed by Terrill Mohr, MD.  She was ultimately taken off of her Eliquis and started on aspirin monotherapy.  In January of this year she redeveloped atrial fibrillation with an associated reduced ejection fraction.  Eliquis was restarted and she was ultimately cardioverted after transesophageal echo.  Her EF was newly depressed and she underwent CTA. She is on amiodarone.   Today she tells me that she is taking amiodarone every other day.  She tells me that she is not short of breath at rest but becomes short of breath with any exertion.  This is occurring despite her being back in normal rhythm.  No outright chest pain that she is aware of.   Presents for PVI today. Procedure reviewed.     Objective Their past medical, social and family history was reveiwed.     ROS:   Please see the history of present illness.    All other systems reviewed and are negative.   EKGs/Labs/Other Studies Reviewed:     The following studies were reviewed today:   October 18, 2022 echo EF 60 to 65% RV function normal Severely dilated left atrium Mild MR Mild AI     September 15, 2022 CTA with FFR CTA showed severe calcification/stenosis in the proximal LAD.  FFR did not show significant stenosis in the LAD. Calcium score greater  than 2000   October 16, 2022 creatinine 0.98   Today's EKG shows sinus rhythm with a ventricular rate of 48 bpm.  Normal intervals.   Physical Exam:     VS:  BP 133/76 (BP Location: Left Arm, Patient Position: Sitting, Cuff Size: Normal)   Pulse 55   Ht 5\' 7"  (1.702 m)   Wt 173 lb 6 oz (78.6 kg)   SpO2 98%   BMI 27.15 kg/m         Wt Readings from Last 3 Encounters:  11/15/22 173 lb 6 oz (78.6 kg)  09/12/22 167 lb (75.8 kg)  08/14/22 165 lb 12.8 oz (75.2 kg)      GEN:  Well nourished, well developed in no acute distress CARDIAC: RRR, no murmurs, rubs, gallops RESPIRATORY:  Clear to auscultation without rales, wheezing or rhonchi          Assessment ASSESSMENT AND PLAN:     1. Coronary artery calcification   2. PAF (paroxysmal atrial fibrillation) (HCC)       #Persistent atrial fibrillation and flutter Maintaining sinus rhythm after recent cardioversion.  On amiodarone 200 mg by mouth once daily.  On Eliquis for stroke prophylaxis.   Discussed treatment options today for AF including antiarrhythmic drug therapy and ablation. Discussed risks, recovery and likelihood of success with each treatment strategy. Risk, benefits, and alternatives to EP study and radiofrequency  ablation for afib were discussed. These risks include but are not limited to stroke, bleeding, vascular damage, tamponade, perforation, damage to the esophagus, lungs, and other structures, pulmonary vein stenosis, worsening renal function, and death.  Discussed potential need for repeat ablation procedures and antiarrhythmic drugs after an initial ablation. The patient understands these risk and wishes to proceed.  We will therefore proceed with catheter ablation at the next available time.  Carto, ICE, anesthesia are requested for the procedure.  Will also obtain CT PV protocol prior to the procedure to exclude LAA thrombus and further evaluate atrial anatomy.      Presents for PVI today. Procedure reviewed.        Signed, Rossie Muskrat. Lalla Brothers, MD, [facility], [facility] 02/13/2023 Electrophysiology  [facility]

## 2023-02-13 NOTE — Discharge Instructions (Signed)

## 2023-02-13 NOTE — Anesthesia Procedure Notes (Signed)
Procedure Name: Intubation Date/Time: 02/13/2023 9:41 AM  Performed by: Margarita Rana, CRNAPre-anesthesia Checklist: Patient identified, Patient being monitored, Timeout performed, Emergency Drugs available and Suction available Patient Re-evaluated:Patient Re-evaluated prior to induction Oxygen Delivery Method: Circle System Utilized Preoxygenation: Pre-oxygenation with 100% oxygen Induction Type: IV induction Ventilation: Mask ventilation without difficulty Laryngoscope Size: Mac and 4 Grade View: Grade II Tube type: Oral Tube size: 7.5 mm Number of attempts: 1 Airway Equipment and Method: Stylet Placement Confirmation: ETT inserted through vocal cords under direct vision, positive ETCO2 and breath sounds checked- equal and bilateral Secured at: 24 cm Tube secured with: Tape Dental Injury: Teeth and Oropharynx as per pre-operative assessment

## 2023-02-13 NOTE — Anesthesia Postprocedure Evaluation (Signed)
Anesthesia Post Note  Patient: Tricia Alvarez  Procedure(s) Performed: ATRIAL FIBRILLATION ABLATION     Patient location during evaluation: PACU Anesthesia Type: General Level of consciousness: awake Pain management: pain level controlled Vital Signs Assessment: post-procedure vital signs reviewed and stable Respiratory status: spontaneous breathing, nonlabored ventilation and respiratory function stable Cardiovascular status: blood pressure returned to baseline and stable Postop Assessment: no apparent nausea or vomiting Anesthetic complications: no   There were no known notable events for this encounter.  Last Vitals:  Vitals:   02/13/23 1206 02/13/23 1215  BP: 108/71 116/69  Pulse: (!) 57 (!) 58  Resp: 16 16  Temp:    SpO2: 97% 92%    Last Pain:  Vitals:   02/13/23 1206  TempSrc:   PainSc: 0-No pain                 Linton Rump

## 2023-02-13 NOTE — Transfer of Care (Signed)
Immediate Anesthesia Transfer of Care Note  Patient: Tricia Alvarez  Procedure(s) Performed: ATRIAL FIBRILLATION ABLATION  Patient Location: Cath Lab  Anesthesia Type:General  Level of Consciousness: awake, alert , oriented, patient cooperative, and responds to stimulation  Airway & Oxygen Therapy: Patient Spontanous Breathing  Post-op Assessment: Report given to RN and Post -op Vital signs reviewed and stable  Post vital signs: Reviewed and stable  Last Vitals:  Vitals Value Taken Time  BP 110/65 02/13/23 1127  Temp    Pulse 67 02/13/23 1128  Resp 26 02/13/23 1128  SpO2 92 % 02/13/23 1128  Vitals shown include unfiled device data.  Last Pain:  Vitals:   02/13/23 0845  TempSrc:   PainSc: 0-No pain         Complications: There were no known notable events for this encounter.

## 2023-02-14 ENCOUNTER — Encounter (HOSPITAL_COMMUNITY): Payer: Self-pay | Admitting: Cardiology

## 2023-02-14 LAB — POCT ACTIVATED CLOTTING TIME
Activated Clotting Time: 336 s
Activated Clotting Time: 305 s

## 2023-03-13 ENCOUNTER — Ambulatory Visit (HOSPITAL_COMMUNITY)
Admission: RE | Admit: 2023-03-13 | Discharge: 2023-03-13 | Disposition: A | Discharge status: Home / Self Care | Payer: Medicare Other | Source: Ambulatory Visit | Attending: Physician Assistant | Admitting: Physician Assistant

## 2023-03-13 ENCOUNTER — Encounter (HOSPITAL_COMMUNITY): Payer: Self-pay | Admitting: Physician Assistant

## 2023-03-13 VITALS — BP 122/88 | HR 54 | Ht 67.0 in | Wt 170.4 lb

## 2023-03-13 DIAGNOSIS — I4819 Other persistent atrial fibrillation: Secondary | ICD-10-CM | POA: Diagnosis not present

## 2023-03-13 DIAGNOSIS — D6869 Other thrombophilia: Secondary | ICD-10-CM | POA: Insufficient documentation

## 2023-03-13 DIAGNOSIS — Z5181 Encounter for therapeutic drug level monitoring: Secondary | ICD-10-CM

## 2023-03-13 DIAGNOSIS — Z79899 Other long term (current) drug therapy: Secondary | ICD-10-CM | POA: Insufficient documentation

## 2023-03-13 DIAGNOSIS — I5020 Unspecified systolic (congestive) heart failure: Secondary | ICD-10-CM | POA: Insufficient documentation

## 2023-03-13 DIAGNOSIS — Z7901 Long term (current) use of anticoagulants: Secondary | ICD-10-CM | POA: Insufficient documentation

## 2023-03-13 DIAGNOSIS — I2510 Atherosclerotic heart disease of native coronary artery without angina pectoris: Secondary | ICD-10-CM | POA: Diagnosis not present

## 2023-03-13 DIAGNOSIS — I110 Hypertensive heart disease with heart failure: Secondary | ICD-10-CM | POA: Insufficient documentation

## 2023-03-13 DIAGNOSIS — I4892 Unspecified atrial flutter: Secondary | ICD-10-CM | POA: Diagnosis present

## 2023-03-13 DIAGNOSIS — I480 Paroxysmal atrial fibrillation: Secondary | ICD-10-CM | POA: Insufficient documentation

## 2023-03-13 NOTE — Progress Notes (Signed)
Primary Care Physician: Lauro Regulus, MD Primary Cardiologist: None Electrophysiologist: Sherryl Manges, MD  Referring Physician: Dr Rupert Stacks is a 67 y.o. female with a history of CAD, HFrEF, hypothyroidism, atrial flutter, atrial fibrillation who presents for follow up in the [facility]. She is status post atrial flutter ablation 2020 in [place]. January 2024 she developed atrial fibrillation with a rapid rate and was hospitalized. Underwent TEE guided cardioversion, started on apixaban and amiodarone. Echo at that time showed reduced EF at 30-35%. Repeat echo 09/2022 showed EF had normalized. She was seen by Dr Lalla Brothers and underwent afib ablation on 02/13/23. Patient is on Eliquis for a CHADS2VASC score of 5.  On follow up today, patient reports that she has done well since her ablation. She remains in SR today. She denies chest pain, swallowing pain, or groin issues.   Today, she denies symptoms of palpitations, chest pain, shortness of breath, orthopnea, PND, lower extremity edema, dizziness, presyncope, syncope, snoring, daytime somnolence, bleeding, or neurologic sequela. The patient is tolerating medications without difficulties and is otherwise without complaint today.    Atrial Fibrillation Risk Factors:  she does not have symptoms or diagnosis of sleep apnea. she does not have a history of rheumatic fever.   Atrial Fibrillation Management history:  Previous antiarrhythmic drugs: amiodarone  Previous cardioversions: 07/18/22 Previous ablations: 2022 flutter, 02/13/23 Anticoagulation history: Eliquis  ROS- All systems are reviewed and negative except as per the HPI above.  Past Medical History:  Diagnosis Date   Arthritis    Atrial flutter (HCC)    a. 07/2018 - occurred on cruise ship. Flutter waves seen after adenosine->DCCV on ship; b. 10/2018 s/p RFCA.   Cancer (HCC)    skin and vulvular   History of cardiac radiofrequency  ablation    Hypothyroidism    Osteopenia    Osteoporosis    PAF (paroxysmal atrial fibrillation) (HCC)    a. prev on dronedarone; b. 08/2019 Holter: No afib-->eliquis d/c'd.   Renal disorder    right nephrectomy    Current Outpatient Medications  Medication Sig Dispense Refill   amiodarone (PACERONE) 200 MG tablet Take 1 tablet (200 mg total) by mouth daily. 90 tablet 1   apixaban (ELIQUIS) 5 MG TABS tablet Take 1 tablet (5 mg total) by mouth 2 (two) times daily. 60 tablet 11   Ascorbic Acid (VITAMIN C PO) Take 2,000 mg by mouth daily.     atorvastatin (LIPITOR) 40 MG tablet Take 1 tablet (40 mg total) by mouth daily. 90 tablet 3   calcium carbonate (OSCAL) 1500 (600 Ca) MG TABS tablet Take 600 mg of elemental calcium by mouth daily.     Cholecalciferol (VITAMIN D) 50 MCG (2000 UT) CAPS Take 2,000 Units by mouth daily.     levothyroxine (SYNTHROID) 75 MCG tablet Take 75 mcg by mouth daily before breakfast.     loratadine (CLARITIN) 10 MG tablet Take 10 mg by mouth daily as needed for allergies.     magnesium gluconate (MAGONATE) 500 MG tablet Take 500 mg by mouth daily.     metoprolol succinate (TOPROL XL) 25 MG 24 hr tablet Take 1 tablet (25 mg total) by mouth daily. 90 tablet 0   Multiple Vitamin (MULTIVITAMIN) tablet Take 1 tablet by mouth daily.     pantoprazole (PROTONIX) 40 MG tablet Take 1 tablet (40 mg total) by mouth daily. 45 tablet 0   sacubitril-valsartan (ENTRESTO) 24-26 MG Take 1 tablet by  mouth twice daily 60 tablet 1   colchicine 0.6 MG tablet Take 1 tablet (0.6 mg total) by mouth daily for 5 days. 5 tablet 0   No current facility-administered medications for this encounter.    Physical Exam: BP 122/88   Pulse (!) 54   Ht 5\' 7"  (1.702 m)   Wt 77.3 kg   BMI 26.69 kg/m   GEN: Well nourished, well developed in no acute distress NECK: No JVD; No carotid bruits CARDIAC: Regular rate and rhythm, no murmurs, rubs, gallops RESPIRATORY:  Clear to auscultation without  rales, wheezing or rhonchi  ABDOMEN: Soft, non-tender, non-distended EXTREMITIES:  No edema; No deformity   Wt Readings from Last 3 Encounters:  03/13/23 77.3 kg  02/13/23 74.4 kg  12/18/22 75.3 kg     EKG today demonstrates  SB, RBBB Vent. rate 54 BPM PR interval 152 ms QRS duration 112 ms QT/QTcB 472/447 ms  Echo 10/18/22 demonstrated   1. Left ventricular ejection fraction, by estimation, is 60 to 65%. Left  ventricular ejection fraction by 2D MOD biplane is 65.5 %. The left  ventricle has normal function. The left ventricle has no regional wall  motion abnormalities. Left ventricular diastolic parameters were normal. The average left ventricular global longitudinal strain is -20.8 %. The global longitudinal strain is normal.   2. Right ventricular systolic function is normal. The right ventricular  size is mildly enlarged.   3. Left atrial size was severely dilated.   4. The mitral valve is normal in structure. Mild mitral valve  regurgitation.   5. The aortic valve is tricuspid. Aortic valve regurgitation is mild.  Mild aortic valve stenosis.   6. Aortic dilatation noted. There is mild dilatation of the ascending  aorta, measuring 39 mm.   7. The inferior vena cava is normal in size with greater than 50%  respiratory variability, suggesting right atrial pressure of 3 mmHg.    CHA2DS2-VASc Score = 5  The patient's score is based upon: CHF History: 1 HTN History: 1 Diabetes History: 0 Stroke History: 0 Vascular Disease History: 1 Age Score: 1 Gender Score: 1       ASSESSMENT AND PLAN: Persistent Atrial Fibrillation/atrial flutter The patient's CHA2DS2-VASc score is 5, indicating a 7.2% annual risk of stroke.   S/p flutter ablation 2022 and afib ablation 02/13/23 Patient appears to be maintaining SR Continue amiodarone 200 mg daily for now Continue Eliquis 5 mg BID with no missed doses for 3 months post ablation. Continue Toprol 25 mg daily  Secondary  Hypercoagulable State (ICD10:  D68.69) The patient is at significant risk for stroke/thromboembolism based upon her CHA2DS2-VASc Score of 5.  Continue Apixaban (Eliquis).   CAD LHC 11/29/22 showed mid LAD stenosis but was not hemydynamically significant. No anginal symptoms On statin  HTN Stable on current regimen  HFrEF EF recovered with SR, suspect TCM Fluid status appears stable   Follow up with Dr Lalla Brothers as scheduled.        Jorja Loa PA-C [facility] [address] [place], [place] [address] [phone]

## 2023-03-14 ENCOUNTER — Encounter: Payer: Self-pay | Admitting: Ophthalmology

## 2023-03-14 NOTE — Anesthesia Preprocedure Evaluation (Addendum)
Anesthesia Evaluation  Patient identified by MRN, date of birth, ID band Patient awake    Reviewed: Allergy & Precautions, H&P , NPO status , Patient's Chart, lab work & pertinent test results  Airway Mallampati: III  TM Distance: >3 FB     Dental  (+) Caps Caps two central upper incisors:   Pulmonary shortness of breath, former smoker          Cardiovascular hypertension, + CAD and +CHF    EP study with catheter ablation of atrial flutter in May 2020 10-18-22   1. Left ventricular ejection fraction, by estimation, is 60 to 65%. Left  ventricular ejection fraction by 2D MOD biplane is 65.5 %. The left  ventricle has normal function. The left ventricle has no regional wall  motion abnormalities. Left ventricular  diastolic parameters were normal. The average left ventricular global  longitudinal strain is -20.8 %. The global longitudinal strain is normal.   2. Right ventricular systolic function is normal. The right ventricular  size is mildly enlarged.   3. Left atrial size was severely dilated.   4. The mitral valve is normal in structure. Mild mitral valve  regurgitation.   5. The aortic valve is tricuspid. Aortic valve regurgitation is mild.  Mild aortic valve stenosis.   6. Aortic dilatation noted. There is mild dilatation of the ascending  aorta, measuring 39 mm.   7. The inferior vena cava is normal in size with greater than 50%  respiratory variability, suggesting right atrial pressure of 3 mmHg.  12-04-22 Conclusions: 1. Moderate single-vessel coronary artery disease with 50 to 60% proximal/mid LAD stenosis with heavy calcification that is not hemodynamically significant (RFR = 0.93).  Minimal luminal irregularities noted in the RCA without obstructive disease. 2. Normal left ventricular systolic function and filling pressure.   02-13-23 ablation for atrial fibrillation    Neuro/Psych negative neurological ROS   negative psych ROS   GI/Hepatic negative GI ROS, Neg liver ROS,,,  Endo/Other  Hypothyroidism    Renal/GU Renal disease  negative genitourinary   Musculoskeletal  (+) Arthritis ,    Abdominal   Peds negative pediatric ROS (+)  Hematology negative hematology ROS (+)   Anesthesia Other Findings Cancer (HCC)  History of cardiac radiofrequency ablation PAF (paroxysmal atrial fibrillation) (HCC) Hypothyroidism Renal disorder  Arthritis Osteopenia  Osteoporosis Atrial flutter (HCC)  Multilevel degenerative disc disease Hypertension  CHF (congestive heart failure) (HCC) Solitary kidney, acquired   Very anxious about procedure today; had "a bad experience" with anesthesia when she had heart cath and ablation for A fib." Attempted to verbally allay patient's fears and will attempt to help her stay calm for procedure.    Reproductive/Obstetrics negative OB ROS                              Anesthesia Physical Anesthesia Plan  ASA: 3  Anesthesia Plan: MAC   Post-op Pain Management:    Induction: Intravenous  PONV Risk Score and Plan:   Airway Management Planned: Natural Airway and Nasal Cannula  Additional Equipment:   Intra-op Plan:   Post-operative Plan:   Informed Consent: I have reviewed the patients History and Physical, chart, labs and discussed the procedure including the risks, benefits and alternatives for the proposed anesthesia with the patient or authorized representative who has indicated his/her understanding and acceptance.     Dental Advisory Given  Plan Discussed with: Anesthesiologist, CRNA and Surgeon  Anesthesia Plan  Comments: (Patient consented for risks of anesthesia including but not limited to:  - adverse reactions to medications - damage to eyes, teeth, lips or other oral mucosa - nerve damage due to positioning  - sore throat or hoarseness - Damage to heart, brain, nerves, lungs, other parts of body or loss  of life  Patient voiced understanding.)         Anesthesia Quick Evaluation

## 2023-03-16 ENCOUNTER — Telehealth: Payer: Self-pay | Admitting: Internal Medicine

## 2023-03-16 MED ORDER — METOPROLOL SUCCINATE ER 25 MG PO TB24: 25.0000 mg | ORAL_TABLET | Freq: Every day | ORAL | 0 refills | Status: DC

## 2023-03-16 NOTE — Telephone Encounter (Signed)
Requested Prescriptions   Signed Prescriptions Disp Refills  . metoprolol succinate (TOPROL-XL) 25 MG 24 hr tablet 90 tablet 0    Sig: Take 1 tablet (25 mg total) by mouth daily.    Authorizing Provider: Duke Salvia    Ordering User: Kendrick Fries

## 2023-03-16 NOTE — Discharge Instructions (Signed)

## 2023-03-16 NOTE — Telephone Encounter (Signed)
*  STAT* If patient is at the pharmacy, call can be transferred to refill team.   1. Which medications need to be refilled? (please list name of each medication and dose if known) metoprolol succinate (TOPROL XL) 25 MG 24 hr tablet     2. Which pharmacy/location (including street and city if local pharmacy) is medication to be sent to? [facility] [address] [place] - [address]   3. Do they need a 30 day or 90 day supply?  90 day supply

## 2023-03-20 ENCOUNTER — Other Ambulatory Visit: Payer: Self-pay

## 2023-03-20 ENCOUNTER — Encounter: Payer: Self-pay | Admitting: Ophthalmology

## 2023-03-20 ENCOUNTER — Ambulatory Visit: Payer: Medicare Other | Admitting: Anesthesiology

## 2023-03-20 ENCOUNTER — Ambulatory Visit
Admission: RE | Admit: 2023-03-20 | Discharge: 2023-03-20 | Disposition: A | Discharge status: Home / Self Care | Payer: Medicare Other | Attending: Ophthalmology | Admitting: Ophthalmology

## 2023-03-20 ENCOUNTER — Encounter
Admission: RE | Disposition: A | Discharge status: Home / Self Care | Payer: Self-pay | Source: Home / Self Care | Attending: Ophthalmology

## 2023-03-20 DIAGNOSIS — I480 Paroxysmal atrial fibrillation: Secondary | ICD-10-CM | POA: Diagnosis not present

## 2023-03-20 DIAGNOSIS — I2510 Atherosclerotic heart disease of native coronary artery without angina pectoris: Secondary | ICD-10-CM | POA: Diagnosis not present

## 2023-03-20 DIAGNOSIS — M8580 Other specified disorders of bone density and structure, unspecified site: Secondary | ICD-10-CM | POA: Diagnosis not present

## 2023-03-20 DIAGNOSIS — M810 Age-related osteoporosis without current pathological fracture: Secondary | ICD-10-CM | POA: Insufficient documentation

## 2023-03-20 DIAGNOSIS — E039 Hypothyroidism, unspecified: Secondary | ICD-10-CM | POA: Insufficient documentation

## 2023-03-20 DIAGNOSIS — I509 Heart failure, unspecified: Secondary | ICD-10-CM | POA: Diagnosis not present

## 2023-03-20 DIAGNOSIS — Z87891 Personal history of nicotine dependence: Secondary | ICD-10-CM | POA: Insufficient documentation

## 2023-03-20 DIAGNOSIS — I110 Hypertensive heart disease with heart failure: Secondary | ICD-10-CM | POA: Insufficient documentation

## 2023-03-20 DIAGNOSIS — H2511 Age-related nuclear cataract, right eye: Secondary | ICD-10-CM | POA: Insufficient documentation

## 2023-03-20 DIAGNOSIS — Z7901 Long term (current) use of anticoagulants: Secondary | ICD-10-CM | POA: Insufficient documentation

## 2023-03-20 HISTORY — DX: Heart failure, unspecified: I50.9

## 2023-03-20 HISTORY — DX: Atherosclerotic heart disease of native coronary artery without angina pectoris: I25.10

## 2023-03-20 HISTORY — DX: Essential (primary) hypertension: I10

## 2023-03-20 HISTORY — DX: Nonrheumatic aortic (valve) insufficiency: I35.1

## 2023-03-20 HISTORY — DX: Nonrheumatic mitral (valve) insufficiency: I34.0

## 2023-03-20 HISTORY — DX: Nonrheumatic aortic (valve) stenosis: I35.0

## 2023-03-20 HISTORY — PX: CATARACT EXTRACTION W/PHACO: SHX586

## 2023-03-20 HISTORY — DX: Cardiomegaly: I51.7

## 2023-03-20 HISTORY — DX: Dorsopathy, unspecified: M53.9

## 2023-03-20 SURGERY — PHACOEMULSIFICATION, CATARACT, WITH IOL INSERTION
Anesthesia: Monitor Anesthesia Care | Site: Eye | Laterality: Right

## 2023-03-20 MED ORDER — BRIMONIDINE TARTRATE-TIMOLOL 0.2-0.5 % OP SOLN
OPHTHALMIC | Status: DC | PRN
  Administered 2023-03-20: 1 [drp] via OPHTHALMIC

## 2023-03-20 MED ORDER — ARMC OPHTHALMIC DILATING DROPS
1.0000 | OPHTHALMIC | Status: DC | PRN
  Administered 2023-03-20 (×3): 1 via OPHTHALMIC

## 2023-03-20 MED ORDER — TETRACAINE HCL 0.5 % OP SOLN
OPHTHALMIC | Status: AC
  Filled 2023-03-20: qty 4

## 2023-03-20 MED ORDER — SIGHTPATH DOSE#1 BSS IO SOLN
INTRAOCULAR | Status: DC | PRN
  Administered 2023-03-20: 63 mL via OPHTHALMIC

## 2023-03-20 MED ORDER — SIGHTPATH DOSE#1 NA CHONDROIT SULF-NA HYALURON 40-17 MG/ML IO SOLN
INTRAOCULAR | Status: DC | PRN
  Administered 2023-03-20: 1 mL via INTRAOCULAR

## 2023-03-20 MED ORDER — FENTANYL CITRATE (PF) 100 MCG/2ML IJ SOLN
INTRAMUSCULAR | Status: DC | PRN
  Administered 2023-03-20: 100 ug via INTRAVENOUS

## 2023-03-20 MED ORDER — SIGHTPATH DOSE#1 BSS IO SOLN
INTRAOCULAR | Status: DC | PRN
  Administered 2023-03-20: 15 mL

## 2023-03-20 MED ORDER — FENTANYL CITRATE (PF) 100 MCG/2ML IJ SOLN
INTRAMUSCULAR | Status: AC
  Filled 2023-03-20: qty 2

## 2023-03-20 MED ORDER — LACTATED RINGERS IV SOLN: INTRAVENOUS | Status: DC

## 2023-03-20 MED ORDER — SIGHTPATH DOSE#1 BSS IO SOLN
INTRAOCULAR | Status: DC | PRN
  Administered 2023-03-20: 1 mL via INTRAMUSCULAR

## 2023-03-20 MED ORDER — TETRACAINE HCL 0.5 % OP SOLN
1.0000 [drp] | OPHTHALMIC | Status: DC | PRN
  Administered 2023-03-20 (×3): 1 [drp] via OPHTHALMIC

## 2023-03-20 MED ORDER — MOXIFLOXACIN HCL 0.5 % OP SOLN
OPHTHALMIC | Status: DC | PRN
  Administered 2023-03-20: .2 mL via OPHTHALMIC

## 2023-03-20 MED ORDER — MIDAZOLAM HCL 2 MG/2ML IJ SOLN
INTRAMUSCULAR | Status: AC
  Filled 2023-03-20: qty 2

## 2023-03-20 MED ORDER — MIDAZOLAM HCL 2 MG/2ML IJ SOLN
INTRAMUSCULAR | Status: DC | PRN
  Administered 2023-03-20: 2 mg via INTRAVENOUS

## 2023-03-20 SURGICAL SUPPLY — 10 items
ANGLE REVERSE CUT SHRT 25GA (CUTTER) ×1
CATARACT SUITE SIGHTPATH (MISCELLANEOUS) ×1
CYSTOTOME ANGL RVRS SHRT 25G (CUTTER) ×1 IMPLANT
FEE CATARACT SUITE SIGHTPATH (MISCELLANEOUS) ×1 IMPLANT
GLOVE BIOGEL PI IND STRL 8 (GLOVE) ×1 IMPLANT
GLOVE SURG LX STRL 8.0 MICRO (GLOVE) ×1 IMPLANT
LENS IOL TECNIS EYHANCE 14.0 (Intraocular Lens) IMPLANT
NDL FILTER BLUNT 18X1 1/2 (NEEDLE) ×1 IMPLANT
NEEDLE FILTER BLUNT 18X1 1/2 (NEEDLE) ×1
SYR 3ML LL SCALE MARK (SYRINGE) ×1 IMPLANT

## 2023-03-20 NOTE — H&P (Signed)
[facility]   Primary Care Physician:  Lauro Regulus, MD Ophthalmologist: Dr. Willey Blade  Pre-Procedure History & Physical: HPI:  Tricia Alvarez is a 67 y.o. female here for cataract surgery.   Past Medical History:  Diagnosis Date   Arthritis    Atrial flutter (HCC)    a. 07/2018 - occurred on cruise ship. Flutter waves seen after adenosine->DCCV on ship; b. 10/2018 s/p RFCA.   CAD (coronary artery disease)    Cancer (HCC)    skin and vulvular   CHF (congestive heart failure) (HCC)    History of cardiac radiofrequency ablation    Hypertension    Hypothyroidism    Left atrial dilation    Mild aortic regurgitation    Mild aortic stenosis by prior echocardiogram    Mild mitral regurgitation by prior echocardiogram    Multilevel degenerative disc disease    Osteopenia    Osteoporosis    PAF (paroxysmal atrial fibrillation) (HCC)    a. prev on dronedarone; b. 08/2019 Holter: No afib-->eliquis d/c'd.   Renal disorder    right nephrectomy   Solitary kidney, acquired 1966   Left kidney remains.  right removed D/T hydronephrosis    Past Surgical History:  Procedure Laterality Date   ATRIAL FIBRILLATION ABLATION N/A 02/13/2023   Procedure: ATRIAL FIBRILLATION ABLATION;  Surgeon: Lanier Prude, MD;  Location: [facility];  Service: Cardiovascular;  Laterality: N/A;   cardiac radiofrequency ablation     CARDIOVERSION N/A 07/18/2022   Procedure: CARDIOVERSION;  Surgeon: Antonieta Iba, MD;  Location: [facility];  Service: Cardiovascular;  Laterality: N/A;   CARPAL TUNNEL RELEASE Bilateral    CESAREAN SECTION     COLONOSCOPY WITH PROPOFOL N/A 05/25/2021   Procedure: COLONOSCOPY WITH PROPOFOL;  Surgeon: Toledo, Boykin Nearing, MD;  Location: [facility];  Service: Gastroenterology;  Laterality: N/A;   CORONARY PRESSURE/FFR STUDY N/A 11/29/2022   Procedure: CORONARY PRESSURE/FFR STUDY;  Surgeon: Yvonne Kendall, MD;  Location: [facility];  Service:  Cardiovascular;  Laterality: N/A;   LEFT HEART CATH AND CORONARY ANGIOGRAPHY Left 11/29/2022   Procedure: LEFT HEART CATH AND CORONARY ANGIOGRAPHY;  Surgeon: Yvonne Kendall, MD;  Location: [facility];  Service: Cardiovascular;  Laterality: Left;   NEPHRECTOMY Right 1966   D/T hydronephrosis   TEE WITHOUT CARDIOVERSION N/A 07/18/2022   Procedure: TRANSESOPHAGEAL ECHOCARDIOGRAM (TEE);  Surgeon: Antonieta Iba, MD;  Location: [facility];  Service: Cardiovascular;  Laterality: N/A;    Prior to Admission medications   Medication Sig Start Date End Date Taking? Authorizing Provider  amiodarone (PACERONE) 200 MG tablet Take 1 tablet (200 mg total) by mouth daily. 09/13/22  Yes Duke Salvia, MD  apixaban (ELIQUIS) 5 MG TABS tablet Take 1 tablet (5 mg total) by mouth 2 (two) times daily. 09/12/22  Yes Duke Salvia, MD  Ascorbic Acid (VITAMIN C PO) Take 2,000 mg by mouth daily.   Yes [provider]  atorvastatin (LIPITOR) 40 MG tablet Take 1 tablet (40 mg total) by mouth daily. 09/12/22  Yes Duke Salvia, MD  calcium carbonate (OSCAL) 1500 (600 Ca) MG TABS tablet Take 600 mg of elemental calcium by mouth daily.   Yes [provider]  Cholecalciferol (VITAMIN D) 50 MCG (2000 UT) CAPS Take 2,000 Units by mouth daily.   Yes [provider]  levothyroxine (SYNTHROID) 75 MCG tablet Take 75 mcg by mouth daily before breakfast. 07/24/22 07/24/23 Yes [provider]  loratadine (CLARITIN)  10 MG tablet Take 10 mg by mouth daily as needed for allergies.   Yes [provider]  magnesium gluconate (MAGONATE) 500 MG tablet Take 500 mg by mouth daily.   Yes [provider]  metoprolol succinate (TOPROL XL) 25 MG 24 hr tablet Take 1 tablet (25 mg total) by mouth daily. 03/16/23  Yes Duke Salvia, MD  Multiple Vitamin (MULTIVITAMIN) tablet Take 1 tablet by mouth daily.   Yes [provider]  pantoprazole (PROTONIX) 40 MG tablet Take 1  tablet (40 mg total) by mouth daily. 02/13/23 03/30/23 Yes Sheilah Pigeon, PA-C  sacubitril-valsartan (ENTRESTO) 24-26 MG Take 1 tablet by mouth twice daily 02/05/23  Yes Duke Salvia, MD    Allergies as of 02/13/2023 - Review Complete 02/13/2023  Allergen Reaction Noted   Morphine and codeine Nausea And Vomiting 10/14/2018    Family History  Problem Relation Age of Onset   Breast cancer Cousin     Social History   Socioeconomic History   Marital status: Divorced    Spouse name: Not on file   Number of children: 3   Years of education: Not on file   Highest education level: Not on file  Occupational History   Not on file  Tobacco Use   Smoking status: Former    Current packs/day: 0.00    Average packs/day: 0.5 packs/day for 43.7 years (21.8 ttl pk-yrs)    Types: Cigarettes    Start date: 41    Quit date: 02/27/2022    Years since quitting: 1.0   Smokeless tobacco: Never   Tobacco comments:    Former smoker 03/13/23  Vaping Use   Vaping status: Never Used  Substance and Sexual Activity   Alcohol use: Yes    Comment: socially   Drug use: Never   Sexual activity: Not on file  Other Topics Concern   Not on file  Social History Narrative   Lives locally by herself.   Active without routinely exercising.   Social Determinants of Health   Financial Resource Strain: Not on file  Food Insecurity: Patient Declined (07/15/2022)   Hunger Vital Sign    Worried About Running Out of Food in the Last Year: Patient declined    Ran Out of Food in the Last Year: Patient declined  Transportation Needs: Not on file  Physical Activity: Not on file  Stress: Not on file  Social Connections: Not on file  Intimate Partner Violence: Not on file    Review of Systems: See HPI, otherwise negative ROS  Physical Exam: BP 139/88   Pulse (!) 56   Temp 97.9 F (36.6 C) (Temporal)   Resp 11   Ht 5\' 7"  (1.702 m)   Wt 77.1 kg   SpO2 99%   BMI 26.63 kg/m  General:   Alert,  cooperative in NAD Head:  Normocephalic and atraumatic. Respiratory:  Normal work of breathing. Cardiovascular:  RRR  Impression/Plan: Tricia Alvarez is here for cataract surgery.  Risks, benefits, limitations, and alternatives regarding cataract surgery have been reviewed with the patient.  Questions have been answered.  All parties agreeable.   Galen Manila, MD  03/20/2023, 11:55 AM

## 2023-03-20 NOTE — Anesthesia Postprocedure Evaluation (Signed)
Anesthesia Post Note  Patient: Olivene Defino  Procedure(s) Performed: CATARACT EXTRACTION PHACO AND INTRAOCULAR LENS PLACEMENT (IOC) RIGHT  5.85  00:42.0 (Right: Eye)  Patient location during evaluation: PACU Anesthesia Type: MAC Level of consciousness: awake and alert Pain management: pain level controlled Vital Signs Assessment: post-procedure vital signs reviewed and stable Respiratory status: spontaneous breathing, nonlabored ventilation, respiratory function stable and patient connected to nasal cannula oxygen Cardiovascular status: stable and blood pressure returned to baseline Postop Assessment: no apparent nausea or vomiting Anesthetic complications: no   No notable events documented.   Last Vitals:  Vitals:   03/20/23 1222 03/20/23 1227  BP: (!) 92/59 92/62  Pulse: (!) 51 (!) 54  Resp: 16 14  Temp:    SpO2: 98% 97%    Last Pain:  Vitals:   03/20/23 1227  TempSrc:   PainSc: 0-No pain                 Roselynne Lortz C Blanca Thornton

## 2023-03-20 NOTE — Op Note (Signed)
PREOPERATIVE DIAGNOSIS:  Nuclear sclerotic cataract of the right eye.   POSTOPERATIVE DIAGNOSIS:  H25.11 Cataract   OPERATIVE PROCEDURE:ORPROCALL@   SURGEON:  Tricia Manila, MD.   ANESTHESIA:  Anesthesiologist: Marisue Humble, MD CRNA: Barbette Hair, CRNA  1.      Managed anesthesia care. 2.      0.39ml of Shugarcaine was instilled in the eye following the paracentesis.   COMPLICATIONS:  None.   TECHNIQUE:   Stop and chop   DESCRIPTION OF PROCEDURE:  The patient was examined and consented in the preoperative holding area where the aforementioned topical anesthesia was applied to the right eye and then brought back to the Operating Room where the right eye was prepped and draped in the usual sterile ophthalmic fashion and a lid speculum was placed. A paracentesis was created with the side port blade and the anterior chamber was filled with viscoelastic. A near clear corneal incision was performed with the steel keratome. A continuous curvilinear capsulorrhexis was performed with a cystotome followed by the capsulorrhexis forceps. Hydrodissection and hydrodelineation were carried out with BSS on a blunt cannula. The lens was removed in a stop and chop  technique and the remaining cortical material was removed with the irrigation-aspiration handpiece. The capsular bag was inflated with viscoelastic and the Technis [id number]  lens was placed in the capsular bag without complication. The remaining viscoelastic was removed from the eye with the irrigation-aspiration handpiece. The wounds were hydrated. The anterior chamber was flushed with BSS and the eye was inflated to physiologic pressure. 0.42ml of Vigamox was placed in the anterior chamber. The wounds were found to be water tight. The eye was dressed with Combigan. The patient was given protective glasses to wear throughout the day and a shield with which to sleep tonight. The patient was also given drops with which to begin a drop regimen today and  will follow-up with me in one day. Implant Name Type Inv. Item Serial No. Manufacturer Lot No. LRB No. Used Action  LENS IOL TECNIS EYHANCE 14.0 - [id number] Intraocular Lens LENS IOL TECNIS EYHANCE 14.0 [id number] [employer]  Right 1 Implanted   Procedure(s): CATARACT EXTRACTION PHACO AND INTRAOCULAR LENS PLACEMENT (IOC) RIGHT  5.85  00:42.0 (Right)  Electronically signed: Galen Alvarez 03/20/2023 12:20 PM

## 2023-03-20 NOTE — Transfer of Care (Signed)
Immediate Anesthesia Transfer of Care Note  Patient: Tricia Alvarez  Procedure(s) Performed: CATARACT EXTRACTION PHACO AND INTRAOCULAR LENS PLACEMENT (IOC) RIGHT  5.85  00:42.0 (Right: Eye)  Patient Location: PACU  Anesthesia Type: MAC  Level of Consciousness: awake, alert  and patient cooperative  Airway and Oxygen Therapy: Patient Spontanous Breathing and Patient connected to supplemental oxygen  Post-op Assessment: Post-op Vital signs reviewed, Patient's Cardiovascular Status Stable, Respiratory Function Stable, Patent Airway and No signs of Nausea or vomiting  Post-op Vital Signs: Reviewed and stable  Complications: No notable events documented.

## 2023-03-21 ENCOUNTER — Encounter: Payer: Self-pay | Admitting: Ophthalmology

## 2023-03-22 ENCOUNTER — Telehealth: Payer: Self-pay | Admitting: Internal Medicine

## 2023-03-22 MED ORDER — AMIODARONE HCL 200 MG PO TABS: 200.0000 mg | ORAL_TABLET | Freq: Every day | ORAL | 0 refills | Status: DC

## 2023-03-22 NOTE — Telephone Encounter (Signed)
Requested Prescriptions   Signed Prescriptions Disp Refills   amiodarone (PACERONE) 200 MG tablet 90 tablet 0    Sig: Take 1 tablet (200 mg total) by mouth daily.    Authorizing Provider: Duke Salvia    Ordering User: Guerry Minors

## 2023-03-22 NOTE — Telephone Encounter (Signed)
*  STAT* If patient is at the pharmacy, call can be transferred to refill team.   1. Which medications need to be refilled? (please list name of each medication and dose if known)   amiodarone (PACERONE) 200 MG tablet   2. Which pharmacy/location (including street and city if local pharmacy) is medication to be sent to?  [facility] [address] [place] - [address]    3. Do they need a 30 day or 90 day supply? 90

## 2023-03-23 ENCOUNTER — Other Ambulatory Visit: Payer: Self-pay | Admitting: Internal Medicine

## 2023-03-26 NOTE — Anesthesia Preprocedure Evaluation (Addendum)
Anesthesia Evaluation  Patient identified by MRN, date of birth, ID band Patient awake    Reviewed: Allergy & Precautions, H&P , NPO status , Patient's Chart, lab work & pertinent test results  Airway Mallampati: III  TM Distance: >3 FB Neck ROM: Full    Dental no notable dental hx.    Pulmonary shortness of breath, former smoker   Pulmonary exam normal breath sounds clear to auscultation       Cardiovascular hypertension, + CAD and +CHF  Normal cardiovascular exam Rhythm:Regular Rate:Normal   EP study with catheter ablation of atrial flutter in May 2020 10-18-22   1. Left ventricular ejection fraction, by estimation, is 60 to 65%. Left  ventricular ejection fraction by 2D MOD biplane is 65.5 %. The left  ventricle has normal function. The left ventricle has no regional wall  motion abnormalities. Left ventricular  diastolic parameters were normal. The average left ventricular global  longitudinal strain is -20.8 %. The global longitudinal strain is normal.   2. Right ventricular systolic function is normal. The right ventricular  size is mildly enlarged.   3. Left atrial size was severely dilated.   4. The mitral valve is normal in structure. Mild mitral valve  regurgitation.   5. The aortic valve is tricuspid. Aortic valve regurgitation is mild.  Mild aortic valve stenosis.   6. Aortic dilatation noted. There is mild dilatation of the ascending  aorta, measuring 39 mm.   7. The inferior vena cava is normal in size with greater than 50%  respiratory variability, suggesting right atrial pressure of 3 mmHg.   12-04-22 Conclusions: 1.         Moderate single-vessel coronary artery disease with 50 to 60% proximal/mid LAD stenosis with heavy calcification that is not hemodynamically significant (RFR = 0.93).  Minimal luminal irregularities noted in the RCA without obstructive disease. 2.Normal left ventricular systolic function  and filling pressure.   02-13-23 ablation for atrial fibrillation      Neuro/Psych negative neurological ROS  negative psych ROS   GI/Hepatic negative GI ROS, Neg liver ROS,,,  Endo/Other  Hypothyroidism    Renal/GU Renal disease  negative genitourinary   Musculoskeletal  (+) Arthritis ,    Abdominal   Peds negative pediatric ROS (+)  Hematology negative hematology ROS (+)   Anesthesia Other Findings Cancer  History of cardiac radiofrequency ablation PAF (paroxysmal atrial fibrillation)  Hypothyroidism Renal disorder  Arthritis Osteopenia  Osteoporosis Atrial flutter (HCC)  Multilevel degenerative disc disease Hypertension  CHF (congestive heart failure) (HCC) Solitary kidney, acquired  Left atrial dilation Mild mitral regurgitation by prior echocardiogram  Mild aortic stenosis by prior echocardiogram Mild aortic regurgitation  CAD (coronary artery disease)  Previous cataract surgery 03-20-23 Dr. Juel Burrow anesthesiologist  Reproductive/Obstetrics negative OB ROS                             Anesthesia Physical Anesthesia Plan  ASA: 3  Anesthesia Plan: MAC   Post-op Pain Management:    Induction: Intravenous  PONV Risk Score and Plan:   Airway Management Planned: Natural Airway and Nasal Cannula  Additional Equipment:   Intra-op Plan:   Post-operative Plan:   Informed Consent: I have reviewed the patients History and Physical, chart, labs and discussed the procedure including the risks, benefits and alternatives for the proposed anesthesia with the patient or authorized representative who has indicated his/her understanding and acceptance.     Dental Advisory Given  Plan Discussed with: Anesthesiologist, CRNA and Surgeon  Anesthesia Plan Comments: (Patient consented for risks of anesthesia including but not limited to:  - adverse reactions to medications - damage to eyes, teeth, lips or other oral mucosa - nerve  damage due to positioning  - sore throat or hoarseness - Damage to heart, brain, nerves, lungs, other parts of body or loss of life  Patient voiced understanding.)        Anesthesia Quick Evaluation

## 2023-04-02 ENCOUNTER — Ambulatory Visit
Admission: RE | Admit: 2023-04-02 | Discharge: 2023-04-02 | Disposition: A | Discharge status: Home / Self Care | Payer: Medicare Other | Source: Ambulatory Visit | Attending: Internal Medicine | Admitting: Internal Medicine

## 2023-04-02 DIAGNOSIS — Z1231 Encounter for screening mammogram for malignant neoplasm of breast: Secondary | ICD-10-CM | POA: Diagnosis present

## 2023-04-02 NOTE — Discharge Instructions (Signed)

## 2023-04-03 ENCOUNTER — Encounter
Admission: RE | Disposition: A | Discharge status: Home / Self Care | Payer: Self-pay | Source: Home / Self Care | Attending: Ophthalmology

## 2023-04-03 ENCOUNTER — Ambulatory Visit
Admission: RE | Admit: 2023-04-03 | Discharge: 2023-04-03 | Disposition: A | Discharge status: Home / Self Care | Payer: Medicare Other | Attending: Ophthalmology | Admitting: Ophthalmology

## 2023-04-03 ENCOUNTER — Ambulatory Visit: Payer: Medicare Other | Admitting: Anesthesiology

## 2023-04-03 ENCOUNTER — Encounter: Payer: Self-pay | Admitting: Ophthalmology

## 2023-04-03 ENCOUNTER — Other Ambulatory Visit: Payer: Self-pay

## 2023-04-03 DIAGNOSIS — E039 Hypothyroidism, unspecified: Secondary | ICD-10-CM | POA: Insufficient documentation

## 2023-04-03 DIAGNOSIS — I110 Hypertensive heart disease with heart failure: Secondary | ICD-10-CM | POA: Diagnosis not present

## 2023-04-03 DIAGNOSIS — I509 Heart failure, unspecified: Secondary | ICD-10-CM | POA: Insufficient documentation

## 2023-04-03 DIAGNOSIS — H2512 Age-related nuclear cataract, left eye: Secondary | ICD-10-CM | POA: Insufficient documentation

## 2023-04-03 DIAGNOSIS — I2510 Atherosclerotic heart disease of native coronary artery without angina pectoris: Secondary | ICD-10-CM | POA: Diagnosis not present

## 2023-04-03 DIAGNOSIS — Z87891 Personal history of nicotine dependence: Secondary | ICD-10-CM | POA: Diagnosis not present

## 2023-04-03 HISTORY — PX: CATARACT EXTRACTION W/PHACO: SHX586

## 2023-04-03 SURGERY — PHACOEMULSIFICATION, CATARACT, WITH IOL INSERTION
Anesthesia: Monitor Anesthesia Care | Laterality: Left

## 2023-04-03 MED ORDER — SIGHTPATH DOSE#1 NA CHONDROIT SULF-NA HYALURON 40-17 MG/ML IO SOLN
INTRAOCULAR | Status: DC | PRN
  Administered 2023-04-03: 1 mL via INTRAOCULAR

## 2023-04-03 MED ORDER — ARMC OPHTHALMIC DILATING DROPS
1.0000 | OPHTHALMIC | Status: DC | PRN
  Administered 2023-04-03 (×3): 1 via OPHTHALMIC

## 2023-04-03 MED ORDER — MIDAZOLAM HCL 2 MG/2ML IJ SOLN
INTRAMUSCULAR | Status: AC
  Filled 2023-04-03: qty 2

## 2023-04-03 MED ORDER — SIGHTPATH DOSE#1 BSS IO SOLN
INTRAOCULAR | Status: DC | PRN
  Administered 2023-04-03: 15 mL via INTRAOCULAR

## 2023-04-03 MED ORDER — ARMC OPHTHALMIC DILATING DROPS
OPHTHALMIC | Status: AC
  Filled 2023-04-03: qty 0.5

## 2023-04-03 MED ORDER — TETRACAINE HCL 0.5 % OP SOLN
OPHTHALMIC | Status: AC
  Filled 2023-04-03: qty 4

## 2023-04-03 MED ORDER — SIGHTPATH DOSE#1 BSS IO SOLN
INTRAOCULAR | Status: DC | PRN
  Administered 2023-04-03: 64 mL via OPHTHALMIC

## 2023-04-03 MED ORDER — FENTANYL CITRATE (PF) 100 MCG/2ML IJ SOLN
INTRAMUSCULAR | Status: DC | PRN
  Administered 2023-04-03: 100 ug via INTRAVENOUS

## 2023-04-03 MED ORDER — LACTATED RINGERS IV SOLN: INTRAVENOUS | Status: DC

## 2023-04-03 MED ORDER — MIDAZOLAM HCL 2 MG/2ML IJ SOLN
INTRAMUSCULAR | Status: DC | PRN
  Administered 2023-04-03 (×2): 2 mg via INTRAVENOUS

## 2023-04-03 MED ORDER — SIGHTPATH DOSE#1 BSS IO SOLN
INTRAOCULAR | Status: DC | PRN
  Administered 2023-04-03: 2 mL

## 2023-04-03 MED ORDER — TETRACAINE HCL 0.5 % OP SOLN
1.0000 [drp] | OPHTHALMIC | Status: DC | PRN
  Administered 2023-04-03 (×3): 1 [drp] via OPHTHALMIC

## 2023-04-03 MED ORDER — FENTANYL CITRATE (PF) 100 MCG/2ML IJ SOLN
INTRAMUSCULAR | Status: AC
  Filled 2023-04-03: qty 2

## 2023-04-03 MED ORDER — MOXIFLOXACIN HCL 0.5 % OP SOLN
OPHTHALMIC | Status: DC | PRN
  Administered 2023-04-03: .2 mL via OPHTHALMIC

## 2023-04-03 MED ORDER — BRIMONIDINE TARTRATE-TIMOLOL 0.2-0.5 % OP SOLN
OPHTHALMIC | Status: DC | PRN
  Administered 2023-04-03: 1 [drp] via OPHTHALMIC

## 2023-04-03 SURGICAL SUPPLY — 10 items
ANGLE REVERSE CUT SHRT 25GA (CUTTER) ×1
CATARACT SUITE SIGHTPATH (MISCELLANEOUS) ×1
CYSTOTOME ANGL RVRS SHRT 25G (CUTTER) ×1 IMPLANT
FEE CATARACT SUITE SIGHTPATH (MISCELLANEOUS) ×1 IMPLANT
GLOVE BIOGEL PI IND STRL 8 (GLOVE) ×1 IMPLANT
GLOVE SURG LX STRL 8.0 MICRO (GLOVE) ×1 IMPLANT
LENS IOL TECNIS EYHANCE 12.0 (Intraocular Lens) IMPLANT
NDL FILTER BLUNT 18X1 1/2 (NEEDLE) ×1 IMPLANT
NEEDLE FILTER BLUNT 18X1 1/2 (NEEDLE) ×1
SYR 3ML LL SCALE MARK (SYRINGE) ×1 IMPLANT

## 2023-04-03 NOTE — Transfer of Care (Signed)
Immediate Anesthesia Transfer of Care Note  Patient: Tricia Alvarez  Procedure(s) Performed: CATARACT EXTRACTION PHACO AND INTRAOCULAR LENS PLACEMENT (IOC) LEFT 8.64 00:53.9 (Left)  Patient Location: PACU  Anesthesia Type: MAC  Level of Consciousness: awake, alert  and patient cooperative  Airway and Oxygen Therapy: Patient Spontanous Breathing and Patient connected to supplemental oxygen  Post-op Assessment: Post-op Vital signs reviewed, Patient's Cardiovascular Status Stable, Respiratory Function Stable, Patent Airway and No signs of Nausea or vomiting  Post-op Vital Signs: Reviewed and stable  Complications: No notable events documented.

## 2023-04-03 NOTE — Anesthesia Postprocedure Evaluation (Signed)
Anesthesia Post Note  Patient: Tricia Alvarez  Procedure(s) Performed: CATARACT EXTRACTION PHACO AND INTRAOCULAR LENS PLACEMENT (IOC) LEFT 8.64 00:53.9 (Left)  Patient location during evaluation: PACU Anesthesia Type: MAC Level of consciousness: awake and alert Pain management: pain level controlled Vital Signs Assessment: post-procedure vital signs reviewed and stable Respiratory status: spontaneous breathing, nonlabored ventilation, respiratory function stable and patient connected to nasal cannula oxygen Cardiovascular status: stable and blood pressure returned to baseline Postop Assessment: no apparent nausea or vomiting Anesthetic complications: no   No notable events documented.   Last Vitals:  Vitals:   04/03/23 1235 04/03/23 1241  BP: (!) 89/76 96/65  Pulse: (!) 54 (!) 53  Resp: (!) 21 12  Temp: (!) 36.4 C (!) 36.4 C  SpO2: 95% 96%    Last Pain:  Vitals:   04/03/23 1241  TempSrc:   PainSc: 0-No pain                 Dedrick Heffner C Daxten Kovalenko

## 2023-04-03 NOTE — H&P (Signed)
[facility]   Primary Care Physician:  Lauro Regulus, MD Ophthalmologist: Dr. Druscilla Brownie  Pre-Procedure History & Physical: HPI:  Tricia Alvarez is a 67 y.o. female here for cataract surgery.   Past Medical History:  Diagnosis Date   Arthritis    Atrial flutter (HCC)    a. 07/2018 - occurred on cruise ship. Flutter waves seen after adenosine->DCCV on ship; b. 10/2018 s/p RFCA.   CAD (coronary artery disease)    Cancer (HCC)    skin and vulvular   CHF (congestive heart failure) (HCC)    History of cardiac radiofrequency ablation    Hypertension    Hypothyroidism    Left atrial dilation    Mild aortic regurgitation    Mild aortic stenosis by prior echocardiogram    Mild mitral regurgitation by prior echocardiogram    Multilevel degenerative disc disease    Osteopenia    Osteoporosis    PAF (paroxysmal atrial fibrillation) (HCC)    a. prev on dronedarone; b. 08/2019 Holter: No afib-->eliquis d/c'd.   Renal disorder    right nephrectomy   Solitary kidney, acquired 1966   Left kidney remains.  right removed D/T hydronephrosis    Past Surgical History:  Procedure Laterality Date   ATRIAL FIBRILLATION ABLATION N/A 02/13/2023   Procedure: ATRIAL FIBRILLATION ABLATION;  Surgeon: Lanier Prude, MD;  [facility]: [facility];  Service: Cardiovascular;  Laterality: N/A;   cardiac radiofrequency ablation     CARDIOVERSION N/A 07/18/2022   Procedure: CARDIOVERSION;  Surgeon: Antonieta Iba, MD;  Location: [facility] ORS;  Service: Cardiovascular;  Laterality: N/A;   CARPAL TUNNEL RELEASE Bilateral    CATARACT EXTRACTION W/PHACO Right 03/20/2023   Procedure: CATARACT EXTRACTION PHACO AND INTRAOCULAR LENS PLACEMENT (IOC) RIGHT  5.85  00:42.0;  Surgeon: Galen Manila, MD;  Location: [facility];  Service: Ophthalmology;  Laterality: Right;   CESAREAN SECTION     COLONOSCOPY WITH PROPOFOL N/A 05/25/2021   Procedure: COLONOSCOPY WITH PROPOFOL;  Surgeon: Toledo,  Boykin Nearing, MD;  Location: [facility];  Service: Gastroenterology;  Laterality: N/A;   CORONARY PRESSURE/FFR STUDY N/A 11/29/2022   Procedure: CORONARY PRESSURE/FFR STUDY;  Surgeon: Yvonne Kendall, MD;  Location: [facility] CV [facility];  Service: Cardiovascular;  Laterality: N/A;   LEFT HEART CATH AND CORONARY ANGIOGRAPHY Left 11/29/2022   Procedure: LEFT HEART CATH AND CORONARY ANGIOGRAPHY;  Surgeon: Yvonne Kendall, MD;  Location: [facility] INVASIVE CV LAB;  Service: Cardiovascular;  Laterality: Left;   NEPHRECTOMY Right 1966   D/T hydronephrosis   TEE WITHOUT CARDIOVERSION N/A 07/18/2022   Procedure: TRANSESOPHAGEAL ECHOCARDIOGRAM (TEE);  Surgeon: Antonieta Iba, MD;  Location: [facility];  Service: Cardiovascular;  Laterality: N/A;    Prior to Admission medications   Medication Sig Start Date End Date Taking? Authorizing Provider  amiodarone (PACERONE) 200 MG tablet Take 1 tablet (200 mg total) by mouth daily. 03/22/23  Yes Duke Salvia, MD  apixaban (ELIQUIS) 5 MG TABS tablet Take 1 tablet (5 mg total) by mouth 2 (two) times daily. 09/12/22  Yes Duke Salvia, MD  Ascorbic Acid (VITAMIN C PO) Take 2,000 mg by mouth daily.   Yes [provider]  atorvastatin (LIPITOR) 40 MG tablet Take 1 tablet (40 mg total) by mouth daily. 09/12/22  Yes Duke Salvia, MD  calcium carbonate (OSCAL) 1500 (600 Ca) MG TABS tablet Take 600 mg of elemental calcium by mouth daily.   Yes [provider]  Cholecalciferol (VITAMIN D) 50 MCG (  2000 UT) CAPS Take 2,000 Units by mouth daily.   Yes [provider]  levothyroxine (SYNTHROID) 75 MCG tablet Take 75 mcg by mouth daily before breakfast. 07/24/22 07/24/23 Yes [provider]  loratadine (CLARITIN) 10 MG tablet Take 10 mg by mouth daily as needed for allergies.   Yes [provider]  magnesium gluconate (MAGONATE) 500 MG tablet Take 500 mg by mouth daily.   Yes [provider]  metoprolol succinate (TOPROL  XL) 25 MG 24 hr tablet Take 1 tablet (25 mg total) by mouth daily. 03/16/23  Yes Duke Salvia, MD  Multiple Vitamin (MULTIVITAMIN) tablet Take 1 tablet by mouth daily.   Yes [provider]  sacubitril-valsartan (ENTRESTO) 24-26 MG Take 1 tablet by mouth twice daily 02/05/23  Yes Duke Salvia, MD  pantoprazole (PROTONIX) 40 MG tablet Take 1 tablet (40 mg total) by mouth daily. 02/13/23 03/30/23  Sheilah Pigeon, PA-C    Allergies as of 02/13/2023 - Review Complete 02/13/2023  Allergen Reaction Noted   Morphine and codeine Nausea And Vomiting 10/14/2018    Family History  Problem Relation Age of Onset   Breast cancer Cousin     Social History   Socioeconomic History   Marital status: Divorced    Spouse name: Not on file   Number of children: 3   Years of education: Not on file   Highest education level: Not on file  Occupational History   Not on file  Tobacco Use   Smoking status: Former    Current packs/day: 0.00    Average packs/day: 0.5 packs/day for 43.7 years (21.8 ttl pk-yrs)    Types: Cigarettes    Start date: 57    Quit date: 02/27/2022    Years since quitting: 1.0   Smokeless tobacco: Never   Tobacco comments:    Former smoker 03/13/23  Vaping Use   Vaping status: Never Used  Substance and Sexual Activity   Alcohol use: Yes    Comment: socially   Drug use: Never   Sexual activity: Not on file  Other Topics Concern   Not on file  Social History Narrative   Lives locally by herself.   Active without routinely exercising.   Social Determinants of Health   Financial Resource Strain: Not on file  Food Insecurity: Patient Declined (07/15/2022)   Hunger Vital Sign    Worried About Running Out of Food in the Last Year: Patient declined    Ran Out of Food in the Last Year: Patient declined  Transportation Needs: Not on file  Physical Activity: Not on file  Stress: Not on file  Social Connections: Not on file  Intimate Partner Violence: Not on  file    Review of Systems: See HPI, otherwise negative ROS  Physical Exam: BP 117/77   Temp 97.9 F (36.6 C) (Temporal)   Ht 5' 7.01" (1.702 m)   Wt 77.3 kg   SpO2 97%   BMI 26.68 kg/m  General:   Alert, cooperative in NAD Head:  Normocephalic and atraumatic. Respiratory:  Normal work of breathing. Cardiovascular:  RRR  Impression/Plan: Arnell Asal is here for cataract surgery.  Risks, benefits, limitations, and alternatives regarding cataract surgery have been reviewed with the patient.  Questions have been answered.  All parties agreeable.   Galen Manila, MD  04/03/2023, 12:10 PM

## 2023-04-03 NOTE — Op Note (Signed)
PREOPERATIVE DIAGNOSIS:  Nuclear sclerotic cataract of the left eye.   POSTOPERATIVE DIAGNOSIS:  Nuclear sclerotic cataract of the left eye.   OPERATIVE PROCEDURE:ORPROCALL@   SURGEON:  Galen Manila, MD.   ANESTHESIA:  Anesthesiologist: Marisue Humble, MD CRNA: Domenic Moras, CRNA  1.      Managed anesthesia care. 2.     0.51ml of Shugarcaine was instilled following the paracentesis   COMPLICATIONS:  None.   TECHNIQUE:   Stop and chop   DESCRIPTION OF PROCEDURE:  The patient was examined and consented in the preoperative holding area where the aforementioned topical anesthesia was applied to the left eye and then brought back to the Operating Room where the left eye was prepped and draped in the usual sterile ophthalmic fashion and a lid speculum was placed. A paracentesis was created with the side port blade and the anterior chamber was filled with viscoelastic. A near clear corneal incision was performed with the steel keratome. A continuous curvilinear capsulorrhexis was performed with a cystotome followed by the capsulorrhexis forceps. Hydrodissection and hydrodelineation were carried out with BSS on a blunt cannula. The lens was removed in a stop and chop  technique and the remaining cortical material was removed with the irrigation-aspiration handpiece. The capsular bag was inflated with viscoelastic and the Technis [id number] lens was placed in the capsular bag without complication. The remaining viscoelastic was removed from the eye with the irrigation-aspiration handpiece. The wounds were hydrated. The anterior chamber was flushed with BSS and the eye was inflated to physiologic pressure. 0.36ml Vigamox was placed in the anterior chamber. The wounds were found to be water tight. The eye was dressed with Combigan. The patient was given protective glasses to wear throughout the day and a shield with which to sleep tonight. The patient was also given drops with which to begin a drop  regimen today and will follow-up with me in one day. Implant Name Type Inv. Item Serial No. Manufacturer Lot No. LRB No. Used Action  LENS IOL TECNIS EYHANCE 12.0 - [id number] Intraocular Lens LENS IOL TECNIS EYHANCE 12.0 [id number] [employer]  Left 1 Implanted    Procedure(s): CATARACT EXTRACTION PHACO AND INTRAOCULAR LENS PLACEMENT (IOC) LEFT 8.64 00:53.9 (Left)  Electronically signed: Galen Manila 04/03/2023 12:33 PM

## 2023-04-05 ENCOUNTER — Encounter: Payer: Self-pay | Admitting: Ophthalmology

## 2023-04-11 ENCOUNTER — Other Ambulatory Visit: Payer: Self-pay | Admitting: Internal Medicine

## 2023-05-15 NOTE — Progress Notes (Unsigned)
Electrophysiology Office Follow up Visit Note:    Date:  05/16/2023   ID:  Jilliam Fritcher, DOB October 22, 1955, MRN [id number]  PCP:  Lauro Regulus, MD  [facility] Cardiologist:  None  [facility] Electrophysiologist:  Lanier Prude, MD    Interval History:     Burlene Hurlbert is a 67 y.o. female who presents for a follow up visit.   Discussed the use of AI scribe software for clinical note transcription with the patient, who gave verbal consent to proceed.  Ms. Brasseaux presents for follow-up after an February 13, 2023 catheter ablation.  During the procedure, the pulmonary veins, posterior wall and CTI were ablated.  She is all [place] on March 13, 2023 in the A-fib clinic.  She was maintaining sinus rhythm at that appointment.  History of Present Illness   Miss Riesgo, a patient with a history of cardiac arrhythmias, presents with intermittent episodes of dizziness. The most recent episode occurred while walking from the car to the clinic, which she speculates may be due to anxiety. She denies any palpitations or sensation of her heart 'taking off.'  She is currently on a regimen of Entresto, Metoprolol, Eliquis, and Amiodarone. She recently underwent cataract surgery and has noticed plaques in her eyes, a known side effect of Amiodarone.  She also expresses concern about the cost of her medications, specifically Eliquis and Entresto, which cost $160 and $140 per month, respectively, after insurance. She has not had any significant bleeding issues, but does report occasional bleeding from hemorrhoids.            Past medical, surgical, social and family history were reviewed.  ROS:   Please see the history of present illness.    All other systems reviewed and are negative.  EKGs/Labs/Other Studies Reviewed:    The following studies were reviewed today:  EKG Interpretation Date/Time:  Wednesday May 16 2023 11:45:00 EST Ventricular Rate:  53 PR  Interval:  144 QRS Duration:  126 QT Interval:  502 QTC Calculation: 471 R Axis:   6  Text Interpretation: Sinus bradycardia Right bundle branch block Confirmed by Steffanie Dunn [phone]) on 05/16/2023 12:22:33 PM    Physical Exam:    VS:  BP 122/68   Pulse (!) 53   Ht 5\' 7"  (1.702 m)   Wt 169 lb (76.7 kg)   SpO2 98%   BMI 26.47 kg/m     Wt Readings from Last 3 Encounters:  05/16/23 169 lb (76.7 kg)  04/03/23 170 lb 6.4 oz (77.3 kg)  03/20/23 170 lb (77.1 kg)     Physical Exam   GENERAL: Well appearing, no distress. CHEST: Lungs clear to auscultation. CARDIOVASCULAR: Normal sinus rhythm, regular rate and rhythm.          ASSESSMENT:    1. Persistent atrial fibrillation (HCC)   2. Encounter for monitoring amiodarone therapy   3. Chronic systolic heart failure (HCC)    PLAN:    In order of problems listed above:  Assessment and Plan    Atrial Fibrillation Stable rhythm on current medication regimen. Occasional dizziness, possibly related to medication side effects. -Discontinue Amiodarone today. -Schedule follow-up with nurse practitioner Rosalita Chessman in six months to assess rhythm in the absence of Amiodarone.  Anticoagulation Currently on Eliquis. Discussed potential future option of Watchman procedure to stop blood thinner while still being protected against stroke. No current bleeding issues, but patient has expressed interest in stopping blood thinner due to cost. -Provide patient with  pamphlet about Watchman procedure. -Consider discussing Watchman procedure further at six-month follow-up appointment.  Heart Failure Stable on Entresto. Patient inquired about potential to discontinue this medication in the future due to cost. -Continue Entresto as prescribed.  Follow-up in 6 months to assess rhythm and discuss potential Watchman procedure.       Stop amiodarone follow-up 6 months        Signed, Steffanie Dunn, MD, [facility], [facility] 05/16/2023 12:35 PM     Electrophysiology [facility]

## 2023-05-16 ENCOUNTER — Encounter: Payer: Self-pay | Admitting: Cardiology

## 2023-05-16 ENCOUNTER — Ambulatory Visit: Payer: Medicare Other | Attending: Cardiology | Admitting: Cardiology

## 2023-05-16 VITALS — BP 122/68 | HR 53 | Ht 67.0 in | Wt 169.0 lb

## 2023-05-16 DIAGNOSIS — Z5181 Encounter for therapeutic drug level monitoring: Secondary | ICD-10-CM

## 2023-05-16 DIAGNOSIS — Z79899 Other long term (current) drug therapy: Secondary | ICD-10-CM

## 2023-05-16 DIAGNOSIS — I5022 Chronic systolic (congestive) heart failure: Secondary | ICD-10-CM | POA: Diagnosis not present

## 2023-05-16 DIAGNOSIS — I4819 Other persistent atrial fibrillation: Secondary | ICD-10-CM

## 2023-05-16 NOTE — Patient Instructions (Signed)
Medication Instructions:  Your physician has recommended you make the following change in your medication:  1) STOP taking amiodarone  *If you need a refill on your cardiac medications before your next appointment, please call your pharmacy*  Follow-Up: At [facility], you and your health needs are our priority.  As part of our continuing mission to provide you with exceptional heart care, we have created designated Provider Care Teams.  These Care Teams include your primary Cardiologist (physician) and Advanced Practice Providers (APPs -  Physician Assistants and Nurse Practitioners) who all work together to provide you with the care you need, when you need it.  Your next appointment:   6 month(s)  Provider:   Sherie Don, NP

## 2023-06-04 ENCOUNTER — Telehealth: Payer: Self-pay | Admitting: Cardiology

## 2023-06-04 NOTE — Telephone Encounter (Signed)
*  STAT* If patient is at the pharmacy, call can be transferred to refill team.   1. Which medications need to be refilled? (please list name of each medication and dose if known)   metoprolol succinate (TOPROL-XL) 25 MG 24 hr tablet    2. Which pharmacy/location (including street and city if local pharmacy) is medication to be sent to?  [place], [place] 3. Do they need a 30 day or 90 day supply?  90 day

## 2023-06-05 MED ORDER — METOPROLOL SUCCINATE ER 25 MG PO TB24: 25.0000 mg | ORAL_TABLET | Freq: Every day | ORAL | 3 refills | Status: DC

## 2023-07-08 ENCOUNTER — Other Ambulatory Visit: Payer: Self-pay | Admitting: Internal Medicine

## 2023-10-02 ENCOUNTER — Other Ambulatory Visit: Payer: Self-pay | Admitting: Internal Medicine

## 2023-10-02 DIAGNOSIS — I480 Paroxysmal atrial fibrillation: Secondary | ICD-10-CM

## 2023-10-02 NOTE — Telephone Encounter (Signed)
 Prescription refill request for Eliquis received. Indication:afib Last office visit:11/24 Scr:1.2  10/24 Age: 68 Weight:76.7  kg  Prescription refilled

## 2023-11-03 ENCOUNTER — Other Ambulatory Visit: Payer: Self-pay | Admitting: Internal Medicine

## 2023-11-06 ENCOUNTER — Telehealth: Payer: Self-pay | Admitting: Cardiology

## 2023-11-06 NOTE — Telephone Encounter (Signed)
*  STAT* If patient is at the pharmacy, call can be transferred to refill team.   1. Which medications need to be refilled? (please list name of each medication and dose if known)   sacubitril -valsartan  (ENTRESTO ) 24-26 MG   2. Would you like to learn more about the convenience, safety, & potential cost savings by using the [facility]?   3. Are you open to using the [facility] (Type [facility]. ).  4. Which pharmacy/location (including street and city if local pharmacy) is medication to be sent to?  [facility] [address], [place] - [address]   5. Do they need a 30 day or 90 day supply?   30 day  Patient stated she still has medication for 5 days.  Patient has appointment scheduled on 5/20 with S. Riddle.

## 2023-11-06 NOTE — Telephone Encounter (Signed)
Pt's medication has already been sent to pt's preferred pharmacy. Confirmation received.

## 2023-11-12 NOTE — Progress Notes (Signed)
 Electrophysiology Clinic Note    Date:  11/13/2023  Patient ID:  Tricia Alvarez, Tricia Alvarez [birth year], MRN [id number] PCP:  Jimmy Moulding, MD  Cardiologist:  None Electrophysiologist: Boyce Byes, MD   Discussed the use of AI scribe software for clinical note transcription with the patient, who gave verbal consent to proceed.   Patient Profile    Chief Complaint: AFib follow-up  History of Present Illness: Tricia Alvarez is a 68 y.o. female with PMH notable for persis Afib, aflutter, HFimpEF; seen today for Boyce Byes, MD for routine electrophysiology followup.  She is s/p AF ablation 01/2023 w isolation of pulm veins, posterior wall, and CTI for atrial flutter.  She last saw Dr. Marven Slimmer 04/2023 at which time amiodarone  was stopped. The patient was concerned about her overall pill burden and associated costs. They discussed watchman procedure.    On follow-up today, she is doing very well from an AF standpoint. She has not had any episodes that she is aware of since last being seen. She continues to take eliquis  BID, no bleeding concerns. She is interested in proceeding with Watchman procedure if it would reduce her pill burden. She questions the cost of the procedure.  She is being worked up by [employer] for her body stiffness, recently completed steroids and is on plaquenil. She has noticed she is able to move and walk easier since starting.   She denies chest pain, chest pressure, SOB, increased edema.    Arrhythmia/Device History Amiodarone  - stopped d/t no AF    ROS:  Please see the history of present illness. All other systems are reviewed and otherwise negative.    Physical Exam    VS:  BP 110/64 (BP Location: Left Arm, Patient Position: Sitting, Cuff Size: Normal)   Pulse (!) 56   Ht 5\' 7"  (1.702 m)   Wt 171 lb (77.6 kg)   BMI 26.78 kg/m  BMI: Body mass index is 26.78 kg/m.  Wt Readings from Last 3 Encounters:  11/13/23 171 lb (77.6 kg)   05/16/23 169 lb (76.7 kg)  04/03/23 170 lb 6.4 oz (77.3 kg)     GEN- The patient is well appearing, alert and oriented x 3 today.   Lungs- Clear to ausculation bilaterally, normal work of breathing.  Heart- Regular rate and rhythm, no murmurs, rubs or gallops Extremities- No peripheral edema, warm, dry    Studies Reviewed   Previous EP, cardiology notes.    EKG is ordered. Personal review of EKG from today shows:    EKG Interpretation Date/Time:  Tuesday Nov 13 2023 12:56:21 EDT Ventricular Rate:  54 PR Interval:  158 QRS Duration:  116 QT Interval:  472 QTC Calculation: 447 R Axis:   -5  Text Interpretation: Sinus bradycardia Incomplete right bundle branch block Nonspecific ST abnormality Confirmed by Shomari Scicchitano [id number]) on 11/13/2023 1:01:38 PM    LHC, 11/29/2022 Moderate single-vessel coronary artery disease with 50 to 60% proximal/mid LAD stenosis with heavy calcification that is not hemodynamically significant (RFR = 0.93).  Minimal luminal irregularities noted in the RCA without obstructive disease. Normal left ventricular systolic function and filling pressure.  TTE, 10/18/2022  1. Left ventricular ejection fraction, by estimation, is 60 to 65%. Left ventricular ejection fraction by 2D MOD biplane is 65.5 %. The left ventricle has normal function. The left ventricle has no regional wall motion abnormalities. Left ventricular diastolic parameters were normal. The average left ventricular global longitudinal strain is -20.8 %. The  global longitudinal strain is normal.   2. Right ventricular systolic function is normal. The right ventricular size is mildly enlarged.   3. Left atrial size was severely dilated.   4. The mitral valve is normal in structure. Mild mitral valve regurgitation.   5. The aortic valve is tricuspid. Aortic valve regurgitation is mild. Mild aortic valve stenosis.   6. Aortic dilatation noted. There is mild dilatation of the ascending aorta, measuring 39  mm.   7. The inferior vena cava is normal in size with greater than 50% respiratory variability, suggesting right atrial pressure of 3 mmHg.      Assessment and Plan     #) persis AFib S/p AF ablation 02/2023 by Dr. Marven Slimmer Maintaining sinus rhythm off amiodarone    #) Hypercoag d/t persis afib CHA2DS2-VASc Score = at least 5 [CHF History: 1, HTN History: 1, Diabetes History: 0, Stroke History: 0, Vascular Disease History: 1, Age Score: 1, Gender Score: 1].  Therefore, the patient's annual risk of stroke is 7.2 %.    Stroke ppx - 5mg  eliquis  BID, appropriately dosed No bleeding concerns Recent CBC stable  She is interested in moving forward with watchman procedure. Will msg Statistician.    #) HFimpEF Euvolemic on exam without SOB, edema Continue GDMT - 25mg  toprol , 24-26 entresto  Update BMP today      Current medicines are reviewed at length with the patient today.   The patient does not have concerns regarding her medicines.  The following changes were made today:  none  Labs/ tests ordered today include:  Orders Placed This Encounter  Procedures   Basic metabolic panel with GFR   Basic metabolic panel with GFR   EKG 95-JOAC     Disposition: Follow up with Dr. Marven Slimmer or EP APP in 6 months, sooner as needed for watchman workup   Signed, Jamoni Broadfoot, NP  11/13/23  1:39 PM  Electrophysiology [facility]

## 2023-11-13 ENCOUNTER — Other Ambulatory Visit
Admission: RE | Admit: 2023-11-13 | Discharge: 2023-11-13 | Disposition: A | Discharge status: Home / Self Care | Source: Ambulatory Visit | Attending: Cardiology | Admitting: Cardiology

## 2023-11-13 ENCOUNTER — Ambulatory Visit: Attending: Cardiology | Admitting: Cardiology

## 2023-11-13 VITALS — BP 110/64 | HR 56 | Ht 67.0 in | Wt 171.0 lb

## 2023-11-13 DIAGNOSIS — I4819 Other persistent atrial fibrillation: Secondary | ICD-10-CM | POA: Insufficient documentation

## 2023-11-13 DIAGNOSIS — I5032 Chronic diastolic (congestive) heart failure: Secondary | ICD-10-CM

## 2023-11-13 DIAGNOSIS — D6869 Other thrombophilia: Secondary | ICD-10-CM

## 2023-11-13 LAB — BASIC METABOLIC PANEL WITH GFR
Anion gap: 10 (ref 5–15)
BUN: 15 mg/dL (ref 8–23)
CO2: 25 mmol/L (ref 22–32)
Calcium: 9.5 mg/dL (ref 8.9–10.3)
Chloride: 93 mmol/L — ABNORMAL LOW (ref 98–111)
Creatinine, Ser: 0.99 mg/dL (ref 0.44–1.00)
GFR, Estimated: >60 mL/min (ref >60)
Glucose, Bld: 92 mg/dL (ref 70–99)
Potassium: 4.6 mmol/L (ref 3.5–5.1)
Sodium: 128 mmol/L — ABNORMAL LOW (ref 135–145)

## 2023-11-13 NOTE — Patient Instructions (Signed)
 Medication Instructions:  The current medical regimen is effective;  continue present plan and medications as directed. Please refer to the Current Medication list given to you today.   *If you need a refill on your cardiac medications before your next appointment, please call your pharmacy*  Lab Work: Your provider would like for you to have following labs drawn today BMET.   If you have labs (blood work) drawn today and your tests are completely normal, you will receive your results only by: MyChart Message (if you have MyChart) OR A paper copy in the mail If you have any lab test that is abnormal or we need to change your treatment, we will call you to review the results.  Follow-Up: At [facility], you and your health needs are our priority.  As part of our continuing mission to provide you with exceptional heart care, our providers are all part of one team.  This team includes your primary Cardiologist (physician) and Advanced Practice Providers or APPs (Physician Assistants and Nurse Practitioners) who all work together to provide you with the care you need, when you need it.  Your next appointment:   6 month(s)  Provider:   Harvie Liner, MD or Suzann Riddle, NP    We recommend signing up for the patient portal called "MyChart".  Sign up information is provided on this After Visit Summary.  MyChart is used to connect with patients for Virtual Visits (Telemedicine).  Patients are able to view lab/test results, encounter notes, upcoming appointments, etc.  Non-urgent messages can be sent to your provider as well.   To learn more about what you can do with MyChart, go to [email or web address].

## 2023-11-26 ENCOUNTER — Telehealth: Payer: Self-pay

## 2023-11-26 NOTE — Telephone Encounter (Signed)
 Left message to call back

## 2023-11-26 NOTE — Telephone Encounter (Signed)
 Spoke with the patient at length. While she wishes to proceed with LAAO, she understands the current schedule is filled and she will need to wait for the fall procedure calendar to be published prior to scheduling. Per request, sent MyChart message with CPT codes and the pre-service phone # in the meantime. She was grateful for assistance.  She understood she will be called when the schedule is released.

## 2023-11-27 ENCOUNTER — Telehealth: Payer: Self-pay

## 2023-11-27 NOTE — Telephone Encounter (Signed)
 Tricia Alvarez: Chicken wing anatomy. Max 22.5/ AVG 20/ Depth 15 Likely use a 24mm device and possibly TruSteer sheath Inf/Mid-Ant TSP RAO 25 CAU 21

## 2023-12-11 ENCOUNTER — Other Ambulatory Visit: Payer: Self-pay | Admitting: Internal Medicine

## 2023-12-11 DIAGNOSIS — Z1231 Encounter for screening mammogram for malignant neoplasm of breast: Secondary | ICD-10-CM

## 2024-02-04 ENCOUNTER — Other Ambulatory Visit: Payer: Self-pay

## 2024-02-04 ENCOUNTER — Telehealth: Payer: Self-pay

## 2024-02-04 DIAGNOSIS — I4819 Other persistent atrial fibrillation: Secondary | ICD-10-CM

## 2024-02-04 NOTE — Telephone Encounter (Signed)
 Scheduled the patient for LAAO on 05/15/2024. She will see Dr. Cindie on 04/16/2024 for formal consult. If they decide together to proceed, she will get labs, soap, and instruction letter at that visit. She was grateful for call and agreed with plan.

## 2024-02-04 NOTE — Telephone Encounter (Signed)
 Called the patient to discuss LAAO. She will be in [facility] 10/8-10/23.  Will offer the patient Watchman procedure on 05/15/2024. Will arrange follow-up with Dr. Cindie within 30 days of the procedure to discuss.

## 2024-03-04 ENCOUNTER — Telehealth: Payer: Self-pay

## 2024-03-04 NOTE — Telephone Encounter (Signed)
 Tricia Alvarez

## 2024-04-02 ENCOUNTER — Ambulatory Visit
Admission: RE | Admit: 2024-04-02 | Discharge: 2024-04-02 | Disposition: A | Discharge status: Home / Self Care | Source: Ambulatory Visit | Attending: Internal Medicine | Admitting: Internal Medicine

## 2024-04-02 DIAGNOSIS — Z1231 Encounter for screening mammogram for malignant neoplasm of breast: Secondary | ICD-10-CM | POA: Insufficient documentation

## 2024-04-04 ENCOUNTER — Encounter

## 2024-04-15 NOTE — Progress Notes (Unsigned)
 Electrophysiology Office Follow up Visit Note:    Date:  04/15/2024   ID:  Tricia Alvarez, DOB 26-Oct-1955, MRN [id number]  PCP:  Lenon Layman ORN, MD  [facility] Cardiologist:  None  [facility] Electrophysiologist:  OLE ONEIDA HOLTS, MD    Interval History:     Tricia Alvarez is a 68 y.o. female who presents for a follow up visit.   The patient last saw Elvie Nov 13, 2023.  She has a history of persistent atrial fibrillation, chronic systolic heart failure.  She was previously on amiodarone .  She had an atrial fibrillation ablation in August 2024.  At that appointment with Elvie she reported no interval episodes of atrial fibrillation.  She was interested in the Watchman procedure in an effort to avoid indefinite exposure to anticoagulation.        Past medical, surgical, social and family history were reviewed.  ROS:   Please see the history of present illness.    All other systems reviewed and are negative.  EKGs/Labs/Other Studies Reviewed:    The following studies were reviewed today:          Physical Exam:    VS:  There were no vitals taken for this visit.    Wt Readings from Last 3 Encounters:  11/13/23 171 lb (77.6 kg)  05/16/23 169 lb (76.7 kg)  04/03/23 170 lb 6.4 oz (77.3 kg)     GEN: no distress CARD: RRR, No MRG RESP: No IWOB. CTAB.      ASSESSMENT:    No diagnosis found. PLAN:    In order of problems listed above:  #Atrial fibrillation Doing well after her catheter ablation in August 2024.  No interval atrial fibrillation. She is now off amiodarone  Still taking Eliquis  for stroke prophylaxis.  She has expressed a desire in the past to avoid indefinite exposure to anticoagulation.  We again discussed left atrial appendage occlusion during today's clinic appointment.  We discussed the procedure itself, associated risks and need for short-term anticoagulation/antiplatelet therapy.  ***?   Watchman  --------------------  I have seen Tricia Alvarez in the office today who is being considered for a Watchman left atrial appendage closure device. I believe they will benefit from this procedure given their history of atrial fibrillation, CHA2DS2-VASc score of 5 and unadjusted ischemic stroke rate of 7.2% per year. Unfortunately, the patient is not felt to be a long term anticoagulation candidate secondary to concern re: long term risk of bleeding on indefinite anticoagulation. The patient's chart has been reviewed and I feel that they would be a candidate for short term oral anticoagulation after Watchman implant.   It is my belief that after undergoing a LAA closure procedure, Tricia Alvarez will not need long term anticoagulation which eliminates anticoagulation side effects and major bleeding risk.   Procedural risks for the Watchman implant have been reviewed with the patient including a 0.5% risk of stroke, <1% risk of perforation and <1% risk of device embolization. Other risks include bleeding, vascular damage, tamponade, worsening renal function, and death. The patient understands these risk and wishes to proceed.     The published clinical data on the safety and effectiveness of WATCHMAN include but are not limited to the following: - Holmes DR, Jess BEARD, Sick P et al. for the PROTECT AF Investigators. Percutaneous closure of the left atrial appendage versus warfarin therapy for prevention of stroke in patients with atrial fibrillation: a randomised non-inferiority trial. Lancet 2009; 374: 534-42. -  Jess BEARD, Doshi SK, Jonita VEAR Satchel D et al. on behalf of the PROTECT AF Investigators. Percutaneous Left Atrial Appendage Closure for Stroke Prophylaxis in Patients With Atrial Fibrillation 2.3-Year Follow-up of the PROTECT AF (Watchman Left Atrial Appendage System for Embolic Protection in Patients With Atrial Fibrillation) Trial. Circulation 2013; 127:720-729. - Alli O, Doshi S,   Kar S, Reddy VY, Sievert H et al. Quality of Life Assessment in the Randomized PROTECT AF (Percutaneous Closure of the Left Atrial Appendage Versus Warfarin Therapy for Prevention of Stroke in Patients With Atrial Fibrillation) Trial of Patients at Risk for Stroke With Nonvalvular Atrial Fibrillation. J Am Coll Cardiol 2013; 61:1790-8. GLENWOOD Satchel DR, Archer RAMAN, Price M, Whisenant B, Sievert H, Doshi S, Huber K, Reddy V. Prospective randomized evaluation of the Watchman left atrial appendage Device in patients with atrial fibrillation versus long-term warfarin therapy; the PREVAIL trial. Journal of the [employer] of Cardiology, Vol. 4, No. 1, 2014, 1-11. - Kar S, Doshi SK, Sadhu A, Horton R, Osorio J et al. Primary outcome evaluation of a next-generation left atrial appendage closure device: results from the PINNACLE FLX trial. Circulation 2021;143(18)1754-1762.    After today's visit with the patient which was dedicated solely for shared decision making visit regarding LAA closure device, the patient decided to proceed with the LAA appendage closure procedure scheduled to be done in the near future at [facility].  HAS-BLED score 2 Hypertension Yes  Abnormal renal and liver function (Dialysis, transplant, Cr >2.26 mg/dL /Cirrhosis or Bilirubin >2x Normal or AST/ALT/AP >3x Normal) No  Stroke No  Bleeding No  Labile INR (Unstable/high INR) No  Elderly (>65) Yes  Drugs or alcohol (>= 8 drinks/week, anti-plt or NSAID) No   CHA2DS2-VASc Score = 5  The patient's score is based upon: CHF History: 1 HTN History: 1 Diabetes History: 0 Stroke History: 0 Vascular Disease History: 1 Age Score: 1 Gender Score: 1    I discussed my upcoming departure from Tricia Alvarez during today's clinic appointment.  She will transition her care to one of my partners, Dr. Kennyth.   Signed, Ole Holts, MD, [facility], [facility] 04/15/2024 12:18 PM    Electrophysiology [facility]

## 2024-04-16 ENCOUNTER — Encounter: Payer: Self-pay | Admitting: Cardiology

## 2024-04-16 ENCOUNTER — Ambulatory Visit: Attending: Cardiology | Admitting: Cardiology

## 2024-04-16 VITALS — BP 144/78 | HR 53 | Ht 67.0 in | Wt 167.2 lb

## 2024-04-16 DIAGNOSIS — I4819 Other persistent atrial fibrillation: Secondary | ICD-10-CM | POA: Diagnosis not present

## 2024-04-16 DIAGNOSIS — I5022 Chronic systolic (congestive) heart failure: Secondary | ICD-10-CM

## 2024-04-16 DIAGNOSIS — I480 Paroxysmal atrial fibrillation: Secondary | ICD-10-CM | POA: Diagnosis not present

## 2024-04-16 NOTE — Patient Instructions (Addendum)
 Medication Instructions:  Your physician recommends that you continue on your current medications as directed. Please refer to the Current Medication list given to you today.  *If you need a refill on your cardiac medications before your next appointment, please call your pharmacy*  Follow-Up: At [facility], you and your health needs are our priority.  As part of our continuing mission to provide you with exceptional heart care, our providers are all part of one team.  This team includes your primary Cardiologist (physician) and Advanced Practice Providers or APPs (Physician Assistants and Nurse Practitioners) who all work together to provide you with the care you need, when you need it.  Your next appointment:   1 year  Provider:   Ardeen Kohler, MD

## 2024-04-18 ENCOUNTER — Telehealth (HOSPITAL_BASED_OUTPATIENT_CLINIC_OR_DEPARTMENT_OTHER): Payer: Self-pay

## 2024-04-18 NOTE — Telephone Encounter (Signed)
 Pharmacy please advise on holding Eliquis  prior to colonoscopy scheduled for 05/07/2024. Last labs (BMET 04/01/2024)  (CBC 01/30/2024) Thank you.

## 2024-04-18 NOTE — Telephone Encounter (Signed)
 Patient with diagnosis of afib on Eliquis  for anticoagulation.    Procedure:  Colonoscopy   Date of Surgery:  Clearance 05/07/24    CHA2DS2-VASc Score = 5   This indicates a 7.2% annual risk of stroke. The patient's score is based upon: CHF History: 1 HTN History: 1 Diabetes History: 0 Stroke History: 0 Vascular Disease History: 1 Age Score: 1 Gender Score: 1    CrCl 58 ml/min Platelet count 209 K  Patient has not had an Afib/aflutter ablation within the last 3 months or DCCV within the last 30 days.   Most recent visit (04/16/2024), patient would like to put watchman procedure on hold for now.  Per office protocol, patient can hold Eliquis  for 2 days prior to procedure.    Patient will not need bridging with Lovenox (enoxaparin) around procedure.  **This guidance is not considered finalized until pre-operative APP has relayed final recommendations.**

## 2024-04-18 NOTE — Telephone Encounter (Signed)
   Name: Tricia Alvarez  DOB: 1955/10/03  MRN: [id number]  Primary Cardiologist: None   Preoperative team, please contact this patient and set up a phone call appointment for further preoperative risk assessment. Please obtain consent and complete medication review. Thank you for your help.  I confirm that guidance regarding antiplatelet and oral anticoagulation therapy has been completed and, if necessary, noted below.  Most recent visit (04/16/2024), patient would like to put watchman procedure on hold for now.   Per office protocol, patient can hold Eliquis  for 2 days prior to procedure.     Patient will not need bridging with Lovenox (enoxaparin) around procedure.  I also confirmed the patient resides in the state of [place] . As per [facility] [employer] telemedicine laws, the patient must reside in the state in which the provider is licensed.   Lamarr Satterfield, NP 04/18/2024, 4:41 PM [facility]

## 2024-04-18 NOTE — Telephone Encounter (Signed)
   Pre-operative Risk Assessment    Patient Name: Tricia Alvarez  DOB: 04-02-1956 MRN: [id number]   Date of last office visit: 04/16/24 Cindie Date of next office visit: NA   Request for Surgical Clearance    Procedure:  Colonoscopy  Date of Surgery:  Clearance 05/07/24                                Surgeon:  Not indicated Surgeon's Group or Practice Name:  [facility] gastroenterology Phone number:  [phone] Fax number:  [phone]   Type of Clearance Requested:   - Medical  - Pharmacy:  Hold Apixaban  (Eliquis ) not indicated   Type of Anesthesia:  Not Indicated   Additional requests/questions:    Bonney Augustin JONETTA Delores   04/18/2024, 3:49 PM

## 2024-04-29 ENCOUNTER — Encounter: Payer: Self-pay | Admitting: Internal Medicine

## 2024-05-01 ENCOUNTER — Other Ambulatory Visit: Payer: Self-pay | Admitting: Cardiology

## 2024-05-07 ENCOUNTER — Ambulatory Visit
Admission: RE | Admit: 2024-05-07 | Discharge: 2024-05-07 | Disposition: A | Discharge status: Home / Self Care | Attending: Internal Medicine | Admitting: Internal Medicine

## 2024-05-07 ENCOUNTER — Encounter: Payer: Self-pay | Admitting: Internal Medicine

## 2024-05-07 ENCOUNTER — Ambulatory Visit: Admitting: Certified Registered Nurse Anesthetist

## 2024-05-07 ENCOUNTER — Encounter
Admission: RE | Disposition: A | Discharge status: Home / Self Care | Payer: Self-pay | Source: Home / Self Care | Attending: Internal Medicine

## 2024-05-07 DIAGNOSIS — I4891 Unspecified atrial fibrillation: Secondary | ICD-10-CM | POA: Diagnosis not present

## 2024-05-07 DIAGNOSIS — Z79890 Hormone replacement therapy (postmenopausal): Secondary | ICD-10-CM | POA: Diagnosis not present

## 2024-05-07 DIAGNOSIS — I480 Paroxysmal atrial fibrillation: Secondary | ICD-10-CM | POA: Diagnosis not present

## 2024-05-07 DIAGNOSIS — M810 Age-related osteoporosis without current pathological fracture: Secondary | ICD-10-CM | POA: Insufficient documentation

## 2024-05-07 DIAGNOSIS — K5730 Diverticulosis of large intestine without perforation or abscess without bleeding: Secondary | ICD-10-CM | POA: Insufficient documentation

## 2024-05-07 DIAGNOSIS — Z1211 Encounter for screening for malignant neoplasm of colon: Secondary | ICD-10-CM | POA: Diagnosis present

## 2024-05-07 DIAGNOSIS — D120 Benign neoplasm of cecum: Secondary | ICD-10-CM | POA: Insufficient documentation

## 2024-05-07 DIAGNOSIS — Z7901 Long term (current) use of anticoagulants: Secondary | ICD-10-CM | POA: Diagnosis not present

## 2024-05-07 DIAGNOSIS — K640 First degree hemorrhoids: Secondary | ICD-10-CM | POA: Insufficient documentation

## 2024-05-07 DIAGNOSIS — D123 Benign neoplasm of transverse colon: Secondary | ICD-10-CM | POA: Diagnosis not present

## 2024-05-07 DIAGNOSIS — Z905 Acquired absence of kidney: Secondary | ICD-10-CM | POA: Insufficient documentation

## 2024-05-07 DIAGNOSIS — I10 Essential (primary) hypertension: Secondary | ICD-10-CM | POA: Diagnosis not present

## 2024-05-07 DIAGNOSIS — Z87891 Personal history of nicotine dependence: Secondary | ICD-10-CM | POA: Insufficient documentation

## 2024-05-07 DIAGNOSIS — Z79899 Other long term (current) drug therapy: Secondary | ICD-10-CM | POA: Insufficient documentation

## 2024-05-07 DIAGNOSIS — R0602 Shortness of breath: Secondary | ICD-10-CM | POA: Insufficient documentation

## 2024-05-07 HISTORY — PX: POLYPECTOMY: SHX149

## 2024-05-07 HISTORY — PX: COLONOSCOPY: SHX5424

## 2024-05-07 SURGERY — COLONOSCOPY
Anesthesia: General

## 2024-05-07 MED ORDER — PROPOFOL 10 MG/ML IV BOLUS
INTRAVENOUS | Status: DC | PRN
Start: 2024-05-07 — End: 2024-05-07
  Administered 2024-05-07: 150 ug/kg/min via INTRAVENOUS
  Administered 2024-05-07: 30 mg via INTRAVENOUS
  Administered 2024-05-07: 70 mg via INTRAVENOUS

## 2024-05-07 MED ORDER — DEXMEDETOMIDINE HCL IN NACL 80 MCG/20ML IV SOLN
INTRAVENOUS | Status: DC | PRN
  Administered 2024-05-07: 4 ug via INTRAVENOUS
  Administered 2024-05-07: 8 ug via INTRAVENOUS

## 2024-05-07 MED ORDER — LIDOCAINE HCL (CARDIAC) PF 100 MG/5ML IV SOSY
PREFILLED_SYRINGE | INTRAVENOUS | Status: DC | PRN
  Administered 2024-05-07: 50 mg via INTRAVENOUS

## 2024-05-07 MED ORDER — SODIUM CHLORIDE 0.9 % IV SOLN: INTRAVENOUS | Status: DC

## 2024-05-07 NOTE — Anesthesia Procedure Notes (Signed)
 Date/Time: 05/07/2024 9:28 AM  Performed by: Dominica Krabbe, CRNAPre-anesthesia Checklist: Patient identified, Emergency Drugs available, Suction available, Patient being monitored and Timeout performed Patient Re-evaluated:Patient Re-evaluated prior to induction Oxygen Delivery Method: Nasal cannula Preoxygenation: Pre-oxygenation with 100% oxygen Induction Type: IV induction

## 2024-05-07 NOTE — Interval H&P Note (Signed)
 History and Physical Interval Note:  05/07/2024 9:18 AM  Tricia Alvarez  has presented today for surgery, with the diagnosis of Personal history of adenomatous and serrated colon polyps [Z86.0101].  The various methods of treatment have been discussed with the patient and family. After consideration of risks, benefits and other options for treatment, the patient has consented to  Procedure(s) with comments: COLONOSCOPY (N/A) - Requesting AM  On Eliquis  as a surgical intervention.  The patient's history has been reviewed, patient examined, no change in status, stable for surgery.  I have reviewed the patient's chart and labs.  Questions were answered to the patient's satisfaction.     Vadito, Kevin Space

## 2024-05-07 NOTE — Op Note (Signed)
 [facility] Gastroenterology Patient Name: Tricia Alvarez Procedure Date: 05/07/2024 9:17 AM MRN: [id number] Account #: [id number] Date of Birth: February 14, 1956 Admit Type: Outpatient Age: 68 Room: [facility] ENDO ROOM 2 Gender: Female Note Status: Finalized Instrument Name: Colon Scope [id number] Procedure:             Colonoscopy Indications:           High risk colon cancer surveillance: Personal history                         of multiple (3 or more) adenomas Providers:             Deah Ottaway K. Aundria MD, MD Referring MD:          Layman ORN. Lenon MD, MD (Referring MD) Medicines:             Propofol  per Anesthesia Complications:         No immediate complications. Estimated blood loss:                         Minimal. Procedure:             Pre-Anesthesia Assessment:                        - The risks and benefits of the procedure and the                         sedation options and risks were discussed with the                         patient. All questions were answered and informed                         consent was obtained.                        - Patient identification and proposed procedure were                         verified prior to the procedure by the nurse. The                         procedure was verified in the procedure room.                        - ASA Grade Assessment: III - A patient with severe                         systemic disease.                        - After reviewing the risks and benefits, the patient                         was deemed in satisfactory condition to undergo the                         procedure.  After obtaining informed consent, the colonoscope was                         passed under direct vision. Throughout the procedure,                         the patient's blood pressure, pulse, and oxygen                         saturations were monitored continuously. The                         Colonoscope  was introduced through the anus and                         advanced to the the cecum, identified by appendiceal                         orifice and ileocecal valve. The colonoscopy was                         performed without difficulty. The patient tolerated                         the procedure well. The quality of the bowel                         preparation was good. The ileocecal valve, appendiceal                         orifice, and rectum were photographed. Findings:      The perianal and digital rectal examinations were normal. Pertinent       negatives include normal sphincter tone and no palpable rectal lesions.      Many medium-mouthed and small-mouthed diverticula were found in the       sigmoid colon and ascending colon.      A 5 mm polyp was found in the cecum. The polyp was sessile. The polyp       was removed with a cold snare. Resection and retrieval were complete.       Estimated blood loss was minimal.      A 4 mm polyp was found in the hepatic flexure. The polyp was sessile.       The polyp was removed with a cold biopsy forceps. Resection and       retrieval were complete. Estimated blood loss was minimal.      Two sessile polyps were found in the transverse colon. The polyps were 4       to 6 mm in size. These polyps were removed with a cold snare. Resection       and retrieval were complete. Estimated blood loss was minimal.      Internal hemorrhoids were found during retroflexion. The hemorrhoids       were Grade I (internal hemorrhoids that do not prolapse).      The exam was otherwise without abnormality. Impression:            - Diverticulosis in the sigmoid colon and in the  ascending colon.                        - One 5 mm polyp in the cecum, removed with a cold                         snare. Resected and retrieved.                        - One 4 mm polyp at the hepatic flexure, removed with                         a cold biopsy  forceps. Resected and retrieved.                        - Two 4 to 6 mm polyps in the transverse colon,                         removed with a cold snare. Resected and retrieved.                        - Internal hemorrhoids.                        - The examination was otherwise normal. Recommendation:        - Patient has a contact number available for                         emergencies. The signs and symptoms of potential                         delayed complications were discussed with the patient.                         Return to normal activities tomorrow. Written                         discharge instructions were provided to the patient.                        - Resume previous diet.                        - Continue present medications.                        - Resume Eliquis  (apixaban ) at prior dose tomorrow.                         Refer to managing physician for further adjustment of                         therapy.                        - Repeat colonoscopy in 3 - 5 years for surveillance                         based on pathology results.                        -  Return to GI office PRN.                        - The findings and recommendations were discussed with                         the patient. Procedure Code(s):     --- Professional ---                        [id number], Colonoscopy, flexible; with removal of                         tumor(s), polyp(s), or other lesion(s) by snare                         technique                        45380, 59, Colonoscopy, flexible; with biopsy, single                         or multiple Diagnosis Code(s):     --- Professional ---                        K57.30, Diverticulosis of large intestine without                         perforation or abscess without bleeding                        D12.0, Benign neoplasm of cecum                        D12.3, Benign neoplasm of transverse colon (hepatic                         flexure or  splenic flexure)                        K64.0, First degree hemorrhoids                        Z86.010, Personal history of colonic polyps CPT copyright 2022 American Medical Association. All rights reserved. The codes documented in this report are preliminary and upon coder review may  be revised to meet current compliance requirements. Ladell MARLA Boss MD, MD 05/07/2024 9:52:53 AM This report has been signed electronically. Number of Addenda: 0 Note Initiated On: 05/07/2024 9:17 AM Scope Withdrawal Time: 0 hours 11 minutes 36 seconds  Total Procedure Duration: 0 hours 15 minutes 27 seconds  Estimated Blood Loss:  Estimated blood loss was minimal. Estimated blood loss                         was minimal.      [facility]

## 2024-05-07 NOTE — H&P (Signed)
 Outpatient short stay form Pre-procedure 05/07/2024 9:17 AM Dima Ferrufino K. Aundria, M.D.  Primary Physician: Layman Piety, M.D.  Reason for visit:  Personal history of adenomatous and serrated colon polyps (Nov 2022).  History of present illness:                            Patient presents for colonoscopy for a personal hx of colon polyps. The patient denies abdominal pain, abnormal weight loss or rectal bleeding.      Current Facility-Administered Medications:    0.9 %  sodium chloride  infusion, , Intravenous, Continuous, Deaver, Verdie Wilms K, MD, Last Rate: 20 mL/hr at 05/07/24 0916, Continued from Pre-op at 05/07/24 0916  Medications Prior to Admission  Medication Sig Dispense Refill Last Dose/Taking   atorvastatin  (LIPITOR) 40 MG tablet Take 1 tablet by mouth once daily 90 tablet 2 05/07/2024 Morning   hydroxychloroquine (PLAQUENIL) 200 MG tablet Take by mouth.   05/07/2024 Morning   levothyroxine  (SYNTHROID ) 75 MCG tablet Take 75 mcg by mouth daily before breakfast.   05/07/2024   metoprolol  succinate (TOPROL  XL) 25 MG 24 hr tablet Take 1 tablet (25 mg total) by mouth daily. 90 tablet 3 05/07/2024 Morning   sacubitril -valsartan  (ENTRESTO ) 24-26 MG Take 1 tablet by mouth twice daily 180 tablet 3 05/07/2024 Morning   apixaban  (ELIQUIS ) 5 MG TABS tablet Take 1 tablet by mouth twice daily 60 tablet 5 05/04/2024   Ascorbic Acid  (VITAMIN C  PO) Take 2,000 mg by mouth daily.      calcium  carbonate (OSCAL) 1500 (600 Ca) MG TABS tablet Take 600 mg of elemental calcium  by mouth daily.      Cholecalciferol  (VITAMIN D ) 50 MCG (2000 UT) CAPS Take 2,000 Units by mouth daily.      loratadine (CLARITIN) 10 MG tablet Take 10 mg by mouth daily as needed for allergies.      magnesium  gluconate (MAGONATE) 500 MG tablet Take 500 mg by mouth daily.      Multiple Vitamin (MULTIVITAMIN) tablet Take 1 tablet by mouth daily.        Allergies  Allergen Reactions   Morphine And Codeine Nausea And Vomiting     Headache     Past Medical History:  Diagnosis Date   Arthritis    Atrial flutter (HCC)    a. 07/2018 - occurred on cruise ship. Flutter waves seen after adenosine->DCCV on ship; b. 10/2018 s/p RFCA.   CAD (coronary artery disease)    Cancer (HCC)    skin and vulvular   CHF (congestive heart failure) (HCC)    History of cardiac radiofrequency ablation    Hypertension    Hypothyroidism    Left atrial dilation    Mild aortic regurgitation    Mild aortic stenosis by prior echocardiogram    Mild mitral regurgitation by prior echocardiogram    Multilevel degenerative disc disease    Osteopenia    Osteoporosis    PAF (paroxysmal atrial fibrillation) (HCC)    a. prev on dronedarone; b. 08/2019 Holter: No afib-->eliquis  d/c'd.   Renal disorder    right nephrectomy   Solitary kidney, acquired 1966   Left kidney remains.  right removed D/T hydronephrosis    Review of systems:  Otherwise negative.    Physical Exam  Gen: Alert, oriented. Appears stated age.  HEENT: [place]/AT. PERRLA. Lungs: CTA, no wheezes. CV: RR nl S1, S2. Abd: soft, benign, no masses. BS+ Ext: No edema. Pulses 2+    Planned  procedures: Proceed with colonoscopy. The patient understands the nature of the planned procedure, indications, risks, alternatives and potential complications including but not limited to bleeding, infection, perforation, damage to internal organs and possible oversedation/side effects from anesthesia. The patient agrees and gives consent to proceed.  Please refer to procedure notes for findings, recommendations and patient disposition/instructions.     Cynthie Garmon K. Aundria, M.D. Gastroenterology 05/07/2024  9:17 AM

## 2024-05-07 NOTE — Transfer of Care (Signed)
 Immediate Anesthesia Transfer of Care Note  Patient: Tricia Alvarez  Procedure(s) Performed: COLONOSCOPY POLYPECTOMY, INTESTINE  Patient Location: Endoscopy Unit  Anesthesia Type:General  Level of Consciousness: awake, alert , and oriented  Airway & Oxygen Therapy: Patient Spontanous Breathing  Post-op Assessment: Report given to RN and Post -op Vital signs reviewed and stable  Post vital signs: Reviewed and stable  Last Vitals:  Vitals Value Taken Time  BP 108/66 05/07/24 09:51  Temp    Pulse 59 05/07/24 09:51  Resp 16 05/07/24 09:53  SpO2 100 % 05/07/24 09:51    Last Pain:  Vitals:   05/07/24 0951  TempSrc:   PainSc: 0-No pain         Complications: No notable events documented.

## 2024-05-07 NOTE — Anesthesia Preprocedure Evaluation (Signed)
 Anesthesia Evaluation  Patient identified by MRN, date of birth, ID band Patient awake    Reviewed: Allergy & Precautions, NPO status , Patient's Chart, lab work & pertinent test results  Airway Mallampati: II  TM Distance: >3 FB Neck ROM: Full    Dental  (+) Teeth Intact   Pulmonary neg pulmonary ROS, shortness of breath, former smoker   Pulmonary exam normal  + wheezing      Cardiovascular Exercise Tolerance: Good hypertension, Pt. on medications negative cardio ROS Normal cardiovascular exam+ dysrhythmias Atrial Fibrillation  Rhythm:Regular Rate:Normal     Neuro/Psych negative neurological ROS  negative psych ROS   GI/Hepatic negative GI ROS, Neg liver ROS,,Patient received Oral Contrast Agents,  Endo/Other  negative endocrine ROSHypothyroidism    Renal/GU negative Renal ROS  negative genitourinary   Musculoskeletal   Abdominal Normal abdominal exam  (+)   Peds negative pediatric ROS (+)  Hematology negative hematology ROS (+)   Anesthesia Other Findings Past Medical History: No date: Arthritis No date: Atrial flutter (HCC)     Comment:  a. 07/2018 - occurred on cruise ship. Flutter waves seen               after adenosine->DCCV on ship; b. 10/2018 s/p RFCA. No date: CAD (coronary artery disease) No date: Cancer (HCC)     Comment:  skin and vulvular No date: CHF (congestive heart failure) (HCC) No date: History of cardiac radiofrequency ablation No date: Hypertension No date: Hypothyroidism No date: Left atrial dilation No date: Mild aortic regurgitation No date: Mild aortic stenosis by prior echocardiogram No date: Mild mitral regurgitation by prior echocardiogram No date: Multilevel degenerative disc disease No date: Osteopenia No date: Osteoporosis No date: PAF (paroxysmal atrial fibrillation) (HCC)     Comment:  a. prev on dronedarone; b. 08/2019 Holter: No               afib-->eliquis  d/c'd. No  date: Renal disorder     Comment:  right nephrectomy 1966: Solitary kidney, acquired     Comment:  Left kidney remains.  right removed D/T hydronephrosis  Past Surgical History: 02/13/2023: ATRIAL FIBRILLATION ABLATION; N/A     Comment:  Procedure: ATRIAL FIBRILLATION ABLATION;  Surgeon:               Cindie Ole DASEN, MD;  Location: [facility];                Service: Cardiovascular;  Laterality: N/A; No date: CARDIAC CATHETERIZATION No date: cardiac radiofrequency ablation 07/18/2022: CARDIOVERSION; N/A     Comment:  Procedure: CARDIOVERSION;  Surgeon: Perla Evalene PARAS,               MD;  Location: [facility];  Service: Cardiovascular;                Laterality: N/A; No date: CARPAL TUNNEL RELEASE; Bilateral 03/20/2023: CATARACT EXTRACTION W/PHACO; Right     Comment:  Procedure: CATARACT EXTRACTION PHACO AND INTRAOCULAR               LENS PLACEMENT (IOC) RIGHT  5.85  00:42.0;  Surgeon:               Jaye Fallow, MD;  Location: [facility];                Service: Ophthalmology;  Laterality: Right; 04/03/2023: CATARACT EXTRACTION W/PHACO; Left     Comment:  Procedure: CATARACT EXTRACTION PHACO AND INTRAOCULAR  LENS PLACEMENT (IOC) LEFT 8.64 00:53.9;  Surgeon:               Jaye Fallow, MD;  Location: [facility];                Service: Ophthalmology;  Laterality: Left; No date: CESAREAN SECTION 05/25/2021: COLONOSCOPY WITH PROPOFOL ; N/A     Comment:  Procedure: COLONOSCOPY WITH PROPOFOL ;  Surgeon: [place],               Ladell POUR, MD;  Location: [facility];  Service:               Gastroenterology;  Laterality: N/A; 11/29/2022: CORONARY PRESSURE/FFR STUDY; N/A     Comment:  Procedure: CORONARY PRESSURE/FFR STUDY;  Surgeon: Mady Bruckner, MD;  Location: [facility];                Service: Cardiovascular;  Laterality: N/A; No date: EYE SURGERY 11/29/2022: LEFT HEART CATH AND CORONARY ANGIOGRAPHY;  Left     Comment:  Procedure: LEFT HEART CATH AND CORONARY ANGIOGRAPHY;                Surgeon: Mady Bruckner, MD;  Location: [facility]               [facility];  Service: Cardiovascular;  Laterality: Left; 1966: NEPHRECTOMY; Right     Comment:  D/T hydronephrosis 07/18/2022: TEE WITHOUT CARDIOVERSION; N/A     Comment:  Procedure: TRANSESOPHAGEAL ECHOCARDIOGRAM (TEE);                Surgeon: Perla Evalene PARAS, MD;  Location: [facility];                Service: Cardiovascular;  Laterality: N/A;  BMI    Body Mass Index: 26.31 kg/m      Reproductive/Obstetrics negative OB ROS                              Anesthesia Physical Anesthesia Plan  ASA: 3  Anesthesia Plan: General   Post-op Pain Management:    Induction: Intravenous  PONV Risk Score and Plan: Propofol  infusion and TIVA  Airway Management Planned: Natural Airway and Nasal Cannula  Additional Equipment:   Intra-op Plan:   Post-operative Plan:   Informed Consent: I have reviewed the patients History and Physical, chart, labs and discussed the procedure including the risks, benefits and alternatives for the proposed anesthesia with the patient or authorized representative who has indicated his/her understanding and acceptance.     Dental Advisory Given  Plan Discussed with: CRNA  Anesthesia Plan Comments:         Anesthesia Quick Evaluation

## 2024-05-07 NOTE — Anesthesia Postprocedure Evaluation (Signed)
 Anesthesia Post Note  Patient: Tricia Alvarez  Procedure(s) Performed: COLONOSCOPY POLYPECTOMY, INTESTINE  Patient location during evaluation: PACU Anesthesia Type: General Level of consciousness: awake and alert Pain management: satisfactory to patient Vital Signs Assessment: post-procedure vital signs reviewed and stable Cardiovascular status: stable Anesthetic complications: no   No notable events documented.   Last Vitals:  Vitals:   05/07/24 1011 05/07/24 1012  BP: 121/70 131/77  Pulse: (!) 56 (!) 56  Resp: 15 14  Temp:    SpO2: 100% 100%    Last Pain:  Vitals:   05/07/24 1000  TempSrc:   PainSc: 0-No pain                 VAN STAVEREN,Juliano Mceachin

## 2024-05-08 LAB — SURGICAL PATHOLOGY

## 2024-05-15 ENCOUNTER — Inpatient Hospital Stay (HOSPITAL_COMMUNITY): Admit: 2024-05-15 | Admitting: Cardiology

## 2024-05-15 ENCOUNTER — Encounter (HOSPITAL_COMMUNITY): Payer: Self-pay

## 2024-05-15 SURGERY — LEFT ATRIAL APPENDAGE OCCLUSION
Anesthesia: General

## 2024-05-19 ENCOUNTER — Other Ambulatory Visit: Payer: Self-pay | Admitting: Cardiology

## 2024-05-21 MED ORDER — ATORVASTATIN CALCIUM 40 MG PO TABS: 40.0000 mg | ORAL_TABLET | Freq: Every day | ORAL | 3 refills | Status: AC

## 2024-05-26 ENCOUNTER — Other Ambulatory Visit: Payer: Self-pay | Admitting: Cardiology

## 2024-06-17 ENCOUNTER — Ambulatory Visit

## 2024-07-11 ENCOUNTER — Telehealth: Payer: Self-pay | Admitting: Cardiology

## 2024-07-11 DIAGNOSIS — I480 Paroxysmal atrial fibrillation: Secondary | ICD-10-CM

## 2024-07-11 NOTE — Telephone Encounter (Signed)
*  STAT* If patient is at the pharmacy, call can be transferred to refill team.   1. Which medications need to be refilled? (please list name of each medication and dose if known)  apixaban (ELIQUIS) 5 MG TABS tablet   2. Which pharmacy/location (including street and city if local pharmacy) is medication to be sent to?  [place], [place]   3. Do they need a 30 day or 90 day supply? 90 day

## 2024-07-16 MED ORDER — APIXABAN 5 MG PO TABS: 5.0000 mg | ORAL_TABLET | Freq: Two times a day (BID) | ORAL | 1 refills | Status: AC

## 2024-07-16 NOTE — Telephone Encounter (Signed)
 Pt's medication was sent to pt's pharmacy as requested. Confirmation received.
